# Patient Record
Sex: Male | Born: 1940 | Race: Asian | Hispanic: No | Marital: Married | Smoking: Never smoker
Health system: Southern US, Community
[De-identification: ages and names within clinical notes are randomized; demographics above are authoritative.]

## PROBLEM LIST (undated history)

## (undated) DIAGNOSIS — I2510 Atherosclerotic heart disease of native coronary artery without angina pectoris: Secondary | ICD-10-CM

## (undated) DIAGNOSIS — J449 Chronic obstructive pulmonary disease, unspecified: Secondary | ICD-10-CM

## (undated) DIAGNOSIS — K219 Gastro-esophageal reflux disease without esophagitis: Secondary | ICD-10-CM

## (undated) DIAGNOSIS — M1990 Unspecified osteoarthritis, unspecified site: Secondary | ICD-10-CM

## (undated) DIAGNOSIS — E7800 Pure hypercholesterolemia, unspecified: Secondary | ICD-10-CM

## (undated) DIAGNOSIS — I10 Essential (primary) hypertension: Secondary | ICD-10-CM

## (undated) HISTORY — PX: EYE SURGERY: SHX253

## (undated) HISTORY — PX: CORONARY ANGIOPLASTY WITH STENT PLACEMENT: SHX49

## (undated) HISTORY — PX: HERNIA REPAIR: SHX51

---

## 2014-01-19 ENCOUNTER — Emergency Department (HOSPITAL_COMMUNITY)
Admission: EM | Admit: 2014-01-19 | Discharge: 2014-01-20 | Disposition: A | Discharge status: Home / Self Care | Payer: Medicare Other | Attending: Emergency Medicine | Admitting: Emergency Medicine

## 2014-01-19 ENCOUNTER — Encounter (HOSPITAL_COMMUNITY): Payer: Self-pay | Admitting: Emergency Medicine

## 2014-01-19 DIAGNOSIS — IMO0002 Reserved for concepts with insufficient information to code with codable children: Secondary | ICD-10-CM | POA: Insufficient documentation

## 2014-01-19 DIAGNOSIS — J449 Chronic obstructive pulmonary disease, unspecified: Secondary | ICD-10-CM | POA: Insufficient documentation

## 2014-01-19 DIAGNOSIS — R609 Edema, unspecified: Secondary | ICD-10-CM | POA: Insufficient documentation

## 2014-01-19 DIAGNOSIS — Z9861 Coronary angioplasty status: Secondary | ICD-10-CM | POA: Insufficient documentation

## 2014-01-19 DIAGNOSIS — I10 Essential (primary) hypertension: Secondary | ICD-10-CM | POA: Insufficient documentation

## 2014-01-19 DIAGNOSIS — I2510 Atherosclerotic heart disease of native coronary artery without angina pectoris: Secondary | ICD-10-CM | POA: Insufficient documentation

## 2014-01-19 DIAGNOSIS — J4489 Other specified chronic obstructive pulmonary disease: Secondary | ICD-10-CM | POA: Insufficient documentation

## 2014-01-19 DIAGNOSIS — Z862 Personal history of diseases of the blood and blood-forming organs and certain disorders involving the immune mechanism: Secondary | ICD-10-CM | POA: Insufficient documentation

## 2014-01-19 DIAGNOSIS — I998 Other disorder of circulatory system: Secondary | ICD-10-CM | POA: Insufficient documentation

## 2014-01-19 DIAGNOSIS — Z79899 Other long term (current) drug therapy: Secondary | ICD-10-CM | POA: Insufficient documentation

## 2014-01-19 DIAGNOSIS — Z8639 Personal history of other endocrine, nutritional and metabolic disease: Secondary | ICD-10-CM | POA: Insufficient documentation

## 2014-01-19 DIAGNOSIS — M129 Arthropathy, unspecified: Secondary | ICD-10-CM | POA: Insufficient documentation

## 2014-01-19 DIAGNOSIS — Z791 Long term (current) use of non-steroidal anti-inflammatories (NSAID): Secondary | ICD-10-CM | POA: Insufficient documentation

## 2014-01-19 DIAGNOSIS — Z8719 Personal history of other diseases of the digestive system: Secondary | ICD-10-CM | POA: Insufficient documentation

## 2014-01-19 DIAGNOSIS — Z7982 Long term (current) use of aspirin: Secondary | ICD-10-CM | POA: Insufficient documentation

## 2014-01-19 HISTORY — DX: Chronic obstructive pulmonary disease, unspecified: J44.9

## 2014-01-19 HISTORY — DX: Unspecified osteoarthritis, unspecified site: M19.90

## 2014-01-19 HISTORY — DX: Gastro-esophageal reflux disease without esophagitis: K21.9

## 2014-01-19 HISTORY — DX: Pure hypercholesterolemia, unspecified: E78.00

## 2014-01-19 HISTORY — DX: Essential (primary) hypertension: I10

## 2014-01-19 LAB — COMPREHENSIVE METABOLIC PANEL
ALBUMIN: 3.3 g/dL — AB (ref 3.5–5.2)
ALT: 21 U/L (ref 0–53)
AST: 35 U/L (ref 0–37)
Alkaline Phosphatase: 56 U/L (ref 39–117)
BUN: 15 mg/dL (ref 6–23)
CALCIUM: 8.8 mg/dL (ref 8.4–10.5)
CO2: 25 mEq/L (ref 19–32)
CREATININE: 1.35 mg/dL (ref 0.50–1.35)
Chloride: 99 mEq/L (ref 96–112)
GFR calc Af Amer: 59 mL/min — ABNORMAL LOW (ref >90)
GFR calc non Af Amer: 51 mL/min — ABNORMAL LOW (ref >90)
Glucose, Bld: 202 mg/dL — ABNORMAL HIGH (ref 70–99)
Potassium: 4 mEq/L (ref 3.7–5.3)
Sodium: 135 mEq/L — ABNORMAL LOW (ref 137–147)
TOTAL PROTEIN: 6.7 g/dL (ref 6.0–8.3)
Total Bilirubin: 0.3 mg/dL (ref 0.3–1.2)

## 2014-01-19 LAB — URINALYSIS, ROUTINE W REFLEX MICROSCOPIC
Bilirubin Urine: NEGATIVE
GLUCOSE, UA: NEGATIVE mg/dL
Hgb urine dipstick: NEGATIVE
Ketones, ur: NEGATIVE mg/dL
LEUKOCYTES UA: NEGATIVE
NITRITE: NEGATIVE
PH: 6.5 (ref 5.0–8.0)
Protein, ur: NEGATIVE mg/dL
SPECIFIC GRAVITY, URINE: 1.004 — AB (ref 1.005–1.030)
Urobilinogen, UA: 0.2 mg/dL (ref 0.0–1.0)

## 2014-01-19 LAB — CBC WITH DIFFERENTIAL/PLATELET
BASOS ABS: 0.1 10*3/uL (ref 0.0–0.1)
BASOS PCT: 1 % (ref 0–1)
EOS PCT: 24 % — AB (ref 0–5)
Eosinophils Absolute: 1.3 10*3/uL — ABNORMAL HIGH (ref 0.0–0.7)
HCT: 38.7 % — ABNORMAL LOW (ref 39.0–52.0)
Hemoglobin: 13 g/dL (ref 13.0–17.0)
LYMPHS PCT: 26 % (ref 12–46)
Lymphs Abs: 1.4 10*3/uL (ref 0.7–4.0)
MCH: 26.1 pg (ref 26.0–34.0)
MCHC: 33.6 g/dL (ref 30.0–36.0)
MCV: 77.7 fL — AB (ref 78.0–100.0)
MONO ABS: 0.5 10*3/uL (ref 0.1–1.0)
Monocytes Relative: 9 % (ref 3–12)
Neutro Abs: 2.3 10*3/uL (ref 1.7–7.7)
Neutrophils Relative %: 41 % — ABNORMAL LOW (ref 43–77)
Platelets: 110 10*3/uL — ABNORMAL LOW (ref 150–400)
RBC: 4.98 MIL/uL (ref 4.22–5.81)
RDW: 13.9 % (ref 11.5–15.5)
WBC: 5.6 10*3/uL (ref 4.0–10.5)

## 2014-01-19 LAB — I-STAT TROPONIN, ED: TROPONIN I, POC: 0.01 ng/mL (ref 0.00–0.08)

## 2014-01-19 LAB — PRO B NATRIURETIC PEPTIDE: PRO B NATRI PEPTIDE: 158.1 pg/mL — AB (ref 0–125)

## 2014-01-19 LAB — PROTIME-INR
INR: 0.98 (ref 0.00–1.49)
Prothrombin Time: 12.8 seconds (ref 11.6–15.2)

## 2014-01-19 MED ORDER — FUROSEMIDE 20 MG PO TABS: 20.0000 mg | ORAL_TABLET | Freq: Every day | ORAL | Status: DC

## 2014-01-19 NOTE — Progress Notes (Signed)
   CARE MANAGEMENT ED NOTE 01/19/2014  Patient:  Jesse Scott,Jesse Scott   Account Number:  [id number]  Date Initiated:  01/19/2014  Documentation initiated by:  Radford PaxFERRERO,Yaniv Lage  Subjective/Objective Assessment:   Patient presents to Ed with swelling, burning and ecchymosis to bilateral lower extremities     Subjective/Objective Assessment Detail:     Action/Plan:   Action/Plan Detail:   Anticipated DC Date:       Status Recommendation to Physician:   Result of Recommendation:    Other ED Services  Consult Working Plan    DC Planning Services  Other  PCP issues    Choice offered to / List presented to:            Status of service:  Completed, signed off  ED Comments:   ED Comments Detail:  Patient's family member reports patient's pcp is Dr. Marlou PorchJason [place]anc[place] .  System updated.

## 2014-01-19 NOTE — ED Notes (Signed)
Pt aware of need for urine specimen, attempting to void at this time.

## 2014-01-19 NOTE — Discharge Instructions (Signed)
Edema °Edema is a buildup of fluids. It is most common in the feet, ankles, and legs. This happens more as a person ages. It may affect one or both legs. °HOME CARE  °· Raise (elevate) the legs or ankles above the level of the heart while lying down. °· Avoid sitting or standing still for a long time. °· Exercise the legs to help the puffiness (swelling) go down. °· A low-salt diet may help lessen the puffiness. °· Only take medicine as told by your doctor. °GET HELP RIGHT AWAY IF:  °· You develop shortness of breath or chest pain. °· You cannot breathe when you lie down. °· You have more puffiness that does not go away with treatment. °· You develop pain or redness in the areas that are puffy. °· You have a temperature by mouth above 102° F (38.9° C), not controlled by medicine. °· You gain 03 lb/1.4 kg or more in 1 day or 05 lb/2.3 kg in a week. °MAKE SURE YOU:  °· Understand these instructions. °· Will watch your condition. °· Will get help right away if you are not doing well or get worse. °Document Released: 03/24/2008 Document Revised: 12/29/2011 Document Reviewed: 03/24/2008 °[employer]® Patient Information ©2014 [employer]. ° °

## 2014-01-19 NOTE — ED Provider Notes (Signed)
CSN: [id number]     Arrival date & time 01/19/14  2112 History   First MD Initiated Contact with Patient 01/19/14 2128     Chief Complaint  Patient presents with  . 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 There is no treatment prior to arrival. Past Medical History  Diagnosis Date  . Arthritis   . Hypertension   . High cholesterol   . GERD (gastroesophageal reflux disease)   . COPD (chronic obstructive pulmonary disease)    coronary artery disease Chronic painful peripheral neuropathy edema Past Surgical History  Procedure Laterality Date  . Hernia repair    . Eye surgery    . Coronary angioplasty with stent placement     Family History  Problem Relation Age of Onset  . Family history unknown: Yes   History  Substance Use Topics  . Smoking status: Never Smoker   . Smokeless tobacco: Not on file  . Alcohol Use: No    Review of Systems 10 Systems reviewed and are negative for acute change except as noted in the HPI.   Allergies  Review of patient's allergies indicates no known allergies.  Home Medications   Current Outpatient Rx  Name  Route  Sig  Dispense  Refill  . aspirin EC 81 MG tablet   Oral   Take 81 mg by mouth daily.         . CRESTOR 20 MG tablet   Oral   Take 20 mg by mouth daily.          Elwin Sleight. DULERA 100-5 MCG/ACT  AERO   Inhalation   Inhale 2 puffs into the lungs 2 (two) times daily.          . fluticasone (FLONASE) 50 MCG/ACT nasal spray   Each Nare   Place 1 spray into both nostrils daily.          Marland Kitchen. gabapentin (NEURONTIN) 300 MG capsule   Oral   Take 300-600 mg by mouth 4 (four) times daily. Patient takes 7am 12pm and 7pm and 600mg  before bedtime         . metoprolol succinate (TOPROL-XL) 50 MG 24 hr tablet   Oral   Take 50 mg by mouth daily. Take with or immediately following a meal.         . NEXIUM 40 MG capsule   Oral   Take 40 mg by mouth daily at 12 noon.          . nitroGLYCERIN (NITROSTAT) 0.4 MG SL tablet   Sublingual   Place 0.4 mg under the tongue every 5 (five) minutes as needed for chest pain.         Marland Kitchen. SINGULAIR 10 MG tablet   Oral   Take 10 mg by mouth at bedtime.          . tamsulosin (FLOMAX) 0.4 MG CAPS capsule   Oral   Take 0.4 mg by mouth daily  after breakfast.          . tiZANidine (ZANAFLEX) 4 MG tablet   Oral   Take 4 mg by mouth every evening.          . traMADol (ULTRAM-ER) 200 MG 24 hr tablet   Oral   Take 200 mg by mouth daily.          . VENTOLIN HFA 108 (90 BASE) MCG/ACT inhaler   Inhalation   Inhale 1 puff into the lungs every 4 (four) hours as needed for wheezing.          . furosemide (LASIX) 20 MG tablet   Oral   Take 1 tablet (20 mg total) by mouth daily.   3 tablet   0    BP 133/76  Pulse 76  Temp(Src) 97.8 F (36.6 C) (Oral)  Resp 16  SpO2 100% Physical Exam  Nursing note and vitals reviewed. Constitutional:  Awake, alert, nontoxic appearance.  HENT:  Head: Atraumatic.  Eyes: Right eye exhibits no discharge. Left eye exhibits no discharge.  Neck: Neck supple.  Cardiovascular: Normal rate and regular rhythm.   No murmur heard. Pulmonary/Chest: Effort normal and breath sounds normal. No respiratory distress. He has no wheezes. He has no rales. He exhibits no tenderness.  Abdominal: Soft. Bowel sounds  are normal. He exhibits no distension. There is no tenderness. There is no rebound and no guarding.  Musculoskeletal: He exhibits edema. He exhibits no tenderness.  Baseline ROM, no obvious new focal weakness. Trace edema to lower legs.  Neurological: He is alert.  Mental status and motor strength appears baseline for patient and situation.  Skin: No rash noted.  Few scattered small nontender ecchymoses less than 3 cm each, one on abdomen, one on right thigh, 1 on each forearm  Psychiatric: He has a normal mood and affect.    ED Course  Procedures (including critical care time) Pt stable in ED with no significant deterioration in condition.Patient / Family / Caregiver informed of clinical course, understand medical decision-making process, and agree with plan. Labs Review Labs Reviewed  CBC WITH DIFFERENTIAL - Abnormal; Notable for the following:    HCT 38.7 (*)    MCV 77.7 (*)    Platelets 110 (*)    Neutrophils Relative % 41 (*)    Eosinophils Relative 24 (*)    Eosinophils Absolute 1.3 (*)    All other components within normal limits  COMPREHENSIVE METABOLIC PANEL - Abnormal; Notable for the following:    Sodium 135 (*)    Glucose, Bld 202 (*)    Albumin 3.3 (*)    GFR calc non Af Amer 51 (*)    GFR calc Af Amer 59 (*)    All other components within normal limits  URINALYSIS, ROUTINE W REFLEX MICROSCOPIC - Abnormal; Notable for the following:    Specific Gravity, Urine 1.004 (*)    All other components within normal limits  PRO B NATRIURETIC PEPTIDE - Abnormal; Notable for the following:    Pro B Natriuretic peptide (BNP) 158.1 (*)    All other components within normal limits  PROTIME-INR  I-STAT TROPOININ, ED   Imaging Review No results found.   EKG Interpretation   Date/Time:  Thursday January 19 2014 21:46:32 EDT Ventricular Rate:  87 PR Interval:  180 QRS Duration: 99 QT Interval:  379 QTC Calculation: 456 R Axis:   49 Text Interpretation:  Sinus rhythm  Ventricular bigeminy Biatrial  enlargement Abnormal R-wave progression, early transition Left  ventricular  hypertrophy Baseline wander in lead(s) V6 No previous ECGs available  Confirmed by [facility]  MD, Jonny Ruiz ([id number]) on 01/19/2014 11:55:55 PM      MDM   Final diagnoses:  Edema    I doubt any other EMC precluding discharge at this time including, but not necessarily limited to the following:renal failure, ACS.    Hurman Horn, MD 01/20/14 [phone]

## 2014-01-19 NOTE — ED Notes (Signed)
Family states a couple days ago his feet were swollen and has been developing random bruises on his body he states are painful  Pt has also been c/o burning sensation in his lower extremities

## 2014-01-19 NOTE — ED Notes (Signed)
Initial Contact - pt to RM3 with family, changed to hospital gown, placed to cardiac/02 monitor.  Per family, pt with inc swelling to BLE, 1+ edema noted.  Pt also with c/o "burning" in bilat legs.  Both of which are not new per family.  Per family, pt also with random bruising x3-4 days.  Pt noted to have bruises to arms, legs and abd.  Pt denies injury or bleeding elsewhere.  Skin otherwise PWD.  Speaking full/clear sentences.  A+Ox4.  NAD.

## 2014-08-05 ENCOUNTER — Emergency Department (HOSPITAL_COMMUNITY): Payer: Medicare Other

## 2014-08-05 ENCOUNTER — Encounter (HOSPITAL_COMMUNITY): Payer: Self-pay | Admitting: Emergency Medicine

## 2014-08-05 ENCOUNTER — Emergency Department (HOSPITAL_COMMUNITY)
Admission: EM | Admit: 2014-08-05 | Discharge: 2014-08-05 | Disposition: A | Discharge status: Home / Self Care | Payer: Medicare Other | Attending: Emergency Medicine | Admitting: Emergency Medicine

## 2014-08-05 DIAGNOSIS — S8992XA Unspecified injury of left lower leg, initial encounter: Secondary | ICD-10-CM | POA: Diagnosis present

## 2014-08-05 DIAGNOSIS — I10 Essential (primary) hypertension: Secondary | ICD-10-CM | POA: Diagnosis not present

## 2014-08-05 DIAGNOSIS — E780 Pure hypercholesterolemia: Secondary | ICD-10-CM | POA: Diagnosis not present

## 2014-08-05 DIAGNOSIS — Z9861 Coronary angioplasty status: Secondary | ICD-10-CM | POA: Diagnosis not present

## 2014-08-05 DIAGNOSIS — J449 Chronic obstructive pulmonary disease, unspecified: Secondary | ICD-10-CM | POA: Diagnosis not present

## 2014-08-05 DIAGNOSIS — Z7951 Long term (current) use of inhaled steroids: Secondary | ICD-10-CM | POA: Diagnosis not present

## 2014-08-05 DIAGNOSIS — M1990 Unspecified osteoarthritis, unspecified site: Secondary | ICD-10-CM | POA: Diagnosis not present

## 2014-08-05 DIAGNOSIS — Z7952 Long term (current) use of systemic steroids: Secondary | ICD-10-CM | POA: Diagnosis not present

## 2014-08-05 DIAGNOSIS — S9030XA Contusion of unspecified foot, initial encounter: Secondary | ICD-10-CM | POA: Insufficient documentation

## 2014-08-05 DIAGNOSIS — Z79899 Other long term (current) drug therapy: Secondary | ICD-10-CM | POA: Diagnosis not present

## 2014-08-05 DIAGNOSIS — K219 Gastro-esophageal reflux disease without esophagitis: Secondary | ICD-10-CM | POA: Insufficient documentation

## 2014-08-05 DIAGNOSIS — S8012XA Contusion of left lower leg, initial encounter: Secondary | ICD-10-CM | POA: Insufficient documentation

## 2014-08-05 DIAGNOSIS — T148XXA Other injury of unspecified body region, initial encounter: Secondary | ICD-10-CM

## 2014-08-05 DIAGNOSIS — Z7982 Long term (current) use of aspirin: Secondary | ICD-10-CM | POA: Diagnosis not present

## 2014-08-05 MED ORDER — NAPROXEN 500 MG PO TABS: 500.0000 mg | ORAL_TABLET | Freq: Two times a day (BID) | ORAL | Status: DC

## 2014-08-05 NOTE — ED Notes (Addendum)
Pt family states pt dropped drill on left leg today, bruising and redness noted to left anterior aspect of foot and also left leg. Pulses present, full ROM noted.

## 2014-08-05 NOTE — Discharge Instructions (Signed)
Contusion A contusion is a deep bruise. Contusions are the result of an injury that caused bleeding under the skin. The contusion may turn blue, purple, or yellow. Minor injuries will give you a painless contusion, but more severe contusions may stay painful and swollen for a few weeks.  CAUSES  A contusion is usually caused by a blow, trauma, or direct force to an area of the body. SYMPTOMS   Swelling and redness of the injured area.  Bruising of the injured area.  Tenderness and soreness of the injured area.  Pain. DIAGNOSIS  The diagnosis can be made by taking a history and physical exam. An X-ray, CT scan, or MRI may be needed to determine if there were any associated injuries, such as fractures. TREATMENT  Specific treatment will depend on what area of the body was injured. In general, the best treatment for a contusion is resting, icing, elevating, and applying cold compresses to the injured area. Over-the-counter medicines may also be recommended for pain control. Ask your caregiver what the best treatment is for your contusion. HOME CARE INSTRUCTIONS   Put ice on the injured area.  Put ice in a plastic bag.  Place a towel between your skin and the bag.  Leave the ice on for 15-20 minutes, 3-4 times a day, or as directed by your health care provider.  Only take over-the-counter or prescription medicines for pain, discomfort, or fever as directed by your caregiver. Your caregiver may recommend avoiding anti-inflammatory medicines (aspirin, ibuprofen, and naproxen) for 48 hours because these medicines may increase bruising.  Rest the injured area.  If possible, elevate the injured area to reduce swelling. SEEK IMMEDIATE MEDICAL CARE IF:   You have increased bruising or swelling.  You have pain that is getting worse.  Your swelling or pain is not relieved with medicines. MAKE SURE YOU:   Understand these instructions.  Will watch your condition.  Will get help right  away if you are not doing well or get worse. Document Released: 07/16/2005 Document Revised: 10/11/2013 Document Reviewed: 08/11/2011 [facility] Patient Information 2015 [place], [place]. This information is not intended to replace advice given to you by your health care provider. Make sure you discuss any questions you have with your health care provider. Compartment Syndrome of the Foot Compartment syndrome of the foot is a condition in which increased tissue pressure in a confined space in your foot causes decreased blood flow. Decreased blood flow can lead to muscle weakness and loss of feeling in your foot. Compartments of your foot contain bones, muscles, blood vessels, and nerves that are wrapped tightly together by tough fibrous tissues (fascia). When an injury occurs to these compartments, there is no room for the tissue to swell. The dangerously high pressure in compartment syndrome restricts the flow of blood to and from the injured areas. Having the syndrome can be an emergency requiring immediate surgery to prevent permanent injury. CAUSES A crushing type of injury to your foot. RISK FACTORS  Blood clots.  Surgery to blood vessels of your leg or foot.  Prolonged compression during a period of consciousness.  Extremely vigorous exercise.  Anabolic steroid use.  Trauma.  An infection throughout the body (sepsis).  Overly tight bandages or casts.  Bites from venomous animals like snakes.  Anticoagulant medicine use that leads to excessive bleeding into the compartments after an injury.  Burns. SIGNS AND SYMPTOMS  New and persistent deep ache.  Pain that is greater than expected for the severity of  an injury.  Numbness, a "pins-and-needles," or electricity-like pain in the foot.  Swelling and tightness.  Bruising. DIAGNOSIS  Your health care provider will perform a physical exam. Sometimes pressures are taken within the compartment. TREATMENT  Treatment involves  relief of pressure in the compartment by cutting the fascia that surrounds it (fasciotomy). You will remain in the hospital while the cuts (incisions) are usually left open for several days. When the swelling has gone away, your incision will be closed.  Document Released: 01/12/2001 Document Revised: 10/11/2013 Document Reviewed: 05/25/2013 [facility] Patient Information 2015 [place]. This information is not intended to replace advice given to you by your health care provider. Make sure you discuss any questions you have with your health care provider.  Deep Vein Thrombosis A deep vein thrombosis (DVT) is a blood clot that develops in the deep, larger veins of the leg, arm, or pelvis. These are more dangerous than clots that might form in veins near the surface of the body. A DVT can lead to serious and even life-threatening complications if the clot breaks off and travels in the bloodstream to the lungs.  A DVT can damage the valves in your leg veins so that instead of flowing upward, the blood pools in the lower leg. This is called post-thrombotic syndrome, and it can result in pain, swelling, discoloration, and sores on the leg. CAUSES Usually, several things contribute to the formation of blood clots. Contributing factors include:  The flow of blood slows down.  The inside of the vein is damaged in some way.  You have a condition that makes blood clot more easily. RISK FACTORS Some people are more likely than others to develop blood clots. Risk factors include:   Smoking.  Being overweight (obese).  Sitting or lying still for a long time. This includes long-distance travel, paralysis, or recovery from an illness or surgery. Other factors that increase risk are:   Older age, especially over 73 years of age.  Having a family history of blood clots or if you have already had a blot clot.  Having major or lengthy surgery. This is especially true for surgery on the hip, knee, or  belly (abdomen). Hip surgery is particularly high risk.  Having a long, thin tube (catheter) placed inside a vein during a medical procedure.  Breaking a hip or leg.  Having cancer or cancer treatment.  Pregnancy and childbirth.  Hormone changes make the blood clot more easily during pregnancy.  The fetus puts pressure on the veins of the pelvis.  There is a risk of injury to veins during delivery or a caesarean delivery. The risk is highest just after childbirth.  Medicines containing the male hormone estrogen. This includes birth control pills and hormone replacement therapy.  Other circulation or heart problems.  SIGNS AND SYMPTOMS When a clot forms, it can either partially or totally block the blood flow in that vein. Symptoms of a DVT can include:  Swelling of the leg or arm, especially if one side is much worse.  Warmth and redness of the leg or arm, especially if one side is much worse.  Pain in an arm or leg. If the clot is in the leg, symptoms may be more noticeable or worse when standing or walking. The symptoms of a DVT that has traveled to the lungs (pulmonary embolism, PE) usually start suddenly and include:  Shortness of breath.  Coughing.  Coughing up blood or blood-tinged mucus.  Chest pain. The chest pain is  often worse with deep breaths.  Rapid heartbeat. Anyone with these symptoms should get emergency medical treatment right away. Do not wait to see if the symptoms will go away. Call your local emergency services (911 in the [place] if you have these symptoms. Do not drive yourself to the hospital. DIAGNOSIS If a DVT is suspected, your health care provider will take a full medical history and perform a physical exam. Tests that also may be required include:  Blood tests, including studies of the clotting properties of the blood.  Ultrasound to see if you have clots in your legs or lungs.  X-rays to show the flow of blood when dye is injected into the  veins (venogram).  Studies of your lungs if you have any chest symptoms. PREVENTION  Exercise the legs regularly. Take a brisk 30-minute walk every day.  Maintain a weight that is appropriate for your height.  Avoid sitting or lying in bed for long periods of time without moving your legs.  Women, particularly those over the age of 35 years, should consider the risks and benefits of taking estrogen medicines, including birth control pills.  Do not smoke, especially if you take estrogen medicines.  Long-distance travel can increase your risk of DVT. You should exercise your legs by walking or pumping the muscles every hour.  Many of the risk factors above relate to situations that exist with hospitalization, either for illness, injury, or elective surgery. Prevention may include medical and nonmedical measures.  Your health care provider will assess you for the need for venous thromboembolism prevention when you are admitted to the hospital. If you are having surgery, your surgeon will assess you the day of or day after surgery. TREATMENT Once identified, a DVT can be treated. It can also be prevented in some circumstances. Once you have had a DVT, you may be at increased risk for a DVT in the future. The most common treatment for DVT is blood-thinning (anticoagulant) medicine, which reduces the blood's tendency to clot. Anticoagulants can stop new blood clots from forming and stop old clots from growing. They cannot dissolve existing clots. Your body does this by itself over time. Anticoagulants can be given by mouth, through an IV tube, or by injection. Your health care provider will determine the best program for you. Other medicines or treatments that may be used are:  Heparin or related medicines (low molecular weight heparin) are often the first treatment for a blood clot. They act quickly. However, they cannot be taken orally and must be given either in shot form or by IV  tube.  Heparin can cause a fall in a component of blood that stops bleeding and forms blood clots (platelets). You will be monitored with blood tests to be sure this does not occur.  Warfarin is an anticoagulant that can be swallowed. It takes a few days to start working, so usually heparin or related medicines are used in combination. Once warfarin is working, heparin is usually stopped.  Factor Xa inhibitor medicines, such as rivaroxaban and apixaban, also reduce blood clotting. These medicines are taken orally and can often be used without heparin or related medicines.  Less commonly, clot dissolving drugs (thrombolytics) are used to dissolve a DVT. They carry a high risk of bleeding, so they are used mainly in severe cases where your life or a part of your body is threatened.  Very rarely, a blood clot in the leg needs to be removed surgically.  If you are unable  to take anticoagulants, your health care provider may arrange for you to have a filter placed in a main vein in your abdomen. This filter prevents clots from traveling to your lungs. HOME CARE INSTRUCTIONS  Take all medicines as directed by your health care provider.  Learn as much as you can about DVT.  Wear a medical alert bracelet or carry a medical alert card.  Ask your health care provider how soon you can go back to normal activities. It is important to stay active to prevent blood clots. If you are on anticoagulant medicine, avoid contact sports.  It is very important to exercise. This is especially important while traveling, sitting, or standing for long periods of time. Exercise your legs by walking or by tightening and relaxing your leg muscles regularly. Take frequent walks.  You may need to wear compression stockings. These are tight elastic stockings that apply pressure to the lower legs. This pressure can help keep the blood in the legs from clotting. Taking Warfarin Warfarin is a daily medicine that is taken by  mouth. Your health care provider will advise you on the length of treatment (usually 3-6 months, sometimes lifelong). If you take warfarin:  Understand how to take warfarin and foods that can affect how warfarin works in Public relations account executive.  Too much and too little warfarin are both dangerous. Too much warfarin increases the risk of bleeding. Too little warfarin continues to allow the risk for blood clots. Warfarin and Regular Blood Testing While taking warfarin, you will need to have regular blood tests to measure your blood clotting time. These blood tests usually include both the prothrombin time (PT) and international normalized ratio (INR) tests. The PT and INR results allow your health care provider to adjust your dose of warfarin. It is very important that you have your PT and INR tested as often as directed by your health care provider.  Warfarin and Your Diet Avoid major changes in your diet, or notify your health care provider before changing your diet. Arrange a visit with a registered dietitian to answer your questions. Many foods, especially foods high in vitamin K, can interfere with warfarin and affect the PT and INR results. You should eat a consistent amount of foods high in vitamin K. Foods high in vitamin K include:   Spinach, kale, broccoli, cabbage, collard and turnip greens, Brussels sprouts, peas, cauliflower, seaweed, and parsley.  Beef and pork liver.  Green tea.  Soybean oil. Warfarin with Other Medicines Many medicines can interfere with warfarin and affect the PT and INR results. You must:  Tell your health care provider about any and all medicines, vitamins, and supplements you take, including aspirin and other over-the-counter anti-inflammatory medicines. Be especially cautious with aspirin and anti-inflammatory medicines. Ask your health care provider before taking these.  Do not take or discontinue any prescribed or over-the-counter medicine except on the advice of your  health care provider or pharmacist. Warfarin Side Effects Warfarin can have side effects, such as easy bruising and difficulty stopping bleeding. Ask your health care provider or pharmacist about other side effects of warfarin. You will need to:  Hold pressure over cuts for longer than usual.  Notify your dentist and other health care providers that you are taking warfarin before you undergo any procedures where bleeding may occur. Warfarin with Alcohol and Tobacco   Drinking alcohol frequently can increase the effect of warfarin, leading to excess bleeding. It is best to avoid alcoholic drinks or to consume only  very small amounts while taking warfarin. Notify your health care provider if you change your alcohol intake.   Do not use any tobacco products including cigarettes, chewing tobacco, or electronic cigarettes. If you smoke, quit. Ask your health care provider for help with quitting smoking. Alternative Medicines to Warfarin: Factor Xa Inhibitor Medicines  These blood-thinning medicines are taken by mouth, usually for several weeks or longer. It is important to take the medicine every single day at the same time each day.  There are no regular blood tests required when using these medicines.  There are fewer food and drug interactions than with warfarin.  The side effects of this class of medicine are similar to those of warfarin, including excessive bruising or bleeding. Ask your health care provider or pharmacist about other potential side effects. SEEK MEDICAL CARE IF:  You notice a rapid heartbeat.  You feel weaker or more tired than usual.  You feel faint.  You notice increased bruising.  You feel your symptoms are not getting better in the time expected.  You believe you are having side effects of medicine. SEEK IMMEDIATE MEDICAL CARE IF:  You have chest pain.  You have trouble breathing.  You have new or increased swelling or pain in one leg.  You cough up  blood.  You notice blood in vomit, in a bowel movement, or in urine. MAKE SURE YOU:  Understand these instructions.  Will watch your condition.  Will get help right away if you are not doing well or get worse. Document Released: 10/06/2005 Document Revised: 02/20/2014 Document Reviewed: 06/13/2013 [facility] Patient Information 2015 [place], [place]. This information is not intended to replace advice given to you by your health care provider. Make sure you discuss any questions you have with your health care provider.

## 2014-08-05 NOTE — ED Provider Notes (Signed)
CSN: [id number]     Arrival date & time 08/05/14  1839 History  This chart was scribed for non-physician practitioner, Arthor CaptainAbigail Raymund Manrique, PA-C,working with Flint MelterElliott L Wentz, MD, by Karle PlumberJennifer Tensley, ED Scribe. This patient was seen in room TR11C/TR11C and the patient's care was started at 9:16 PM.  Chief Complaint  Patient presents with  . Leg Pain   Patient is a 73 y.o. male presenting with leg pain. The history is provided by the patient. No language interpreter was used.  Leg Pain Associated symptoms: no fever    HPI Comments:  Jesse Scott is a 73 y.o. male with PMHx of HTN and hyperlipidemia who presents to the Emergency Department complaining of a left foot and leg injury secondary to dropping a drill on the foot approximately three hours ago. He reports moderate pain and bruising of the left dorsal foot and anterior lower shin. He has not taken anything for the pain. He denies numbness, tingling, or weakness of the lower extremities, nausea, vomiting or calf swelling. Pt is not a smoker. Pt is ambulatory without issue.  Past Medical History  Diagnosis Date  . Arthritis   . Hypertension   . High cholesterol   . GERD (gastroesophageal reflux disease)   . COPD (chronic obstructive pulmonary disease)    Past Surgical History  Procedure Laterality Date  . Hernia repair    . Eye surgery    . Coronary angioplasty with stent placement     No family history on file. History  Substance Use Topics  . Smoking status: Never Smoker   . Smokeless tobacco: Not on file  . Alcohol Use: No    Review of Systems  Constitutional: Negative for fever and chills.  Gastrointestinal: Negative for nausea and vomiting.  Musculoskeletal: Positive for arthralgias.  Skin: Positive for color change.  Neurological: Negative for weakness and numbness.    Allergies  Review of patient's allergies indicates no known allergies.  Home Medications   Prior to Admission medications   Medication Sig Start Date  End Date Taking? Authorizing Provider  alendronate (FOSAMAX) 70 MG tablet Take 70 mg by mouth once a week. Take with a full glass of water on an empty stomach. Take on Mondays   Yes Historical Provider, MD  aspirin EC 81 MG tablet Take 81 mg by mouth daily.   Yes Historical Provider, MD  CRESTOR 20 MG tablet Take 20 mg by mouth daily.  12/26/13  Yes Historical Provider, MD  DULERA 100-5 MCG/ACT AERO Inhale 2 puffs into the lungs 2 (two) times daily.  01/09/14  Yes Historical Provider, MD  fluticasone (FLONASE) 50 MCG/ACT nasal spray Place 1 spray into both nostrils daily.  11/01/13  Yes Historical Provider, MD  gabapentin (NEURONTIN) 300 MG capsule Take 300-600 mg by mouth 4 (four) times daily. Patient takes 7am 12pm and 7pm and 600mg  before bedtime 12/26/13  Yes Historical Provider, MD  NEXIUM 40 MG capsule Take 40 mg by mouth daily at 12 noon.  11/17/13  Yes Historical Provider, MD  nitroGLYCERIN (NITROSTAT) 0.4 MG SL tablet Place 0.4 mg under the tongue every 5 (five) minutes as needed for chest pain.   Yes Historical Provider, MD  prednisoLONE 5 MG TABS tablet Take 5 mg by mouth daily.   Yes Historical Provider, MD  SINGULAIR 10 MG tablet Take 10 mg by mouth at bedtime.  11/17/13  Yes Historical Provider, MD  tamsulosin (FLOMAX) 0.4 MG CAPS capsule Take 0.4 mg by mouth daily after breakfast.  01/17/14  Yes Historical Provider, MD  tiZANidine (ZANAFLEX) 4 MG tablet Take 4 mg by mouth every evening.  11/04/13  Yes Historical Provider, MD  traMADol (ULTRAM-ER) 200 MG 24 hr tablet Take 200 mg by mouth daily.  10/28/13  Yes Historical Provider, MD  VENTOLIN HFA 108 (90 BASE) MCG/ACT inhaler Inhale 1 puff into the lungs every 4 (four) hours as needed for wheezing.  11/28/13  Yes Historical Provider, MD   Triage Vitals: BP 164/81  Pulse 76  Temp(Src) 97.8 F (36.6 C) (Oral)  Resp 15  SpO2 93% Physical Exam  Nursing note and vitals reviewed. Constitutional: He is oriented to person, place, and time. He appears  well-developed and well-nourished.  HENT:  Head: Normocephalic and atraumatic.  Eyes: EOM are normal.  Neck: Normal range of motion.  Cardiovascular: Normal rate.   Good pedal pulses bilaterally.  Pulmonary/Chest: Effort normal.  Musculoskeletal: Normal range of motion.  Contusion over distal tibia and fibula and over mid foot. Full ROM of left ankle and toes.  Neurological: He is alert and oriented to person, place, and time.  Sensations intact bilaterally.  Skin: Skin is warm and dry.  Psychiatric: He has a normal mood and affect. His behavior is normal.    ED Course  Procedures (including critical care time) DIAGNOSTIC STUDIES: Oxygen Saturation is 93% on RA, low by my interpretation.   COORDINATION OF CARE: 9:22 PM- Return precautions discussed. Advised pt to apply ice compact and will prescribe NSAID. Pt verbalizes understanding and agrees to plan.  Medications - No data to display  Labs Review Labs Reviewed - No data to display  Imaging Review Dg Tibia/fibula Left  08/05/2014   CLINICAL DATA:  Mid left lower leg bruising, pain and bright redness after dropping a metal drill on his left foot today.  EXAM: LEFT TIBIA AND FIBULA - 2 VIEW  COMPARISON:  None.  FINDINGS: Minimal left knee degenerative spur formation. Minimal calcaneal spur formation. Small amount of vascular calcification. No fracture or dislocation seen.  IMPRESSION: No fracture.   Electronically Signed   By: Gordan PaymentSteve  Reid M.D.   On: 08/05/2014 20:39   Dg Foot Complete Left  08/05/2014   CLINICAL DATA:  Bright redness and pain with bruising on the anterior mid left foot after dropping a metal drill on his foot today.  EXAM: LEFT FOOT - COMPLETE 3+ VIEW  COMPARISON:  None.  FINDINGS: Minimal calcaneal spur formation. No fracture or dislocation. Diffuse osteopenia.  IMPRESSION: No fracture or dislocation.   Electronically Signed   By: Gordan PaymentSteve  Reid M.D.   On: 08/05/2014 20:38     EKG Interpretation None       MDM   Final diagnoses:  Contusion     PLAN: apply ice packs, elevate the injured limb, see primary care physician in follow up, prescription for NSAID given. No signs of DVT. Discussed and dvt sxs and compartment syndrome sxs with patient's family. Written information given. Discussed need for movement and walking to prevent dvt. See orders for this visit as documented in the electronic medical record. I personally reviewed the imaging tests through PACS system. I have reviewed and interpreted Lab values. I reviewed available ER/hospitalization records through the EMR    I personally performed the services described in this documentation, which was scribed in my presence. The recorded information has been reviewed and is accurate.    Arthor CaptainAbigail Breda Bond, PA-C 08/09/14 1148

## 2014-08-05 NOTE — ED Notes (Signed)
Pt reports dropping heavy drill on left foot appx 1 hour ago. No puncture wound. Pt has bruising now to lower left anteiror leg and top of foot. Pt has full ROM. Sensation/pulses intact. Pt in NAD.

## 2014-08-10 NOTE — ED Provider Notes (Signed)
Medical screening examination/treatment/procedure(s) were performed by non-physician practitioner and as supervising physician I was immediately available for consultation/collaboration.   EKG Interpretation None       Doha Boling L Bethsaida Siegenthaler, MD 08/10/14 2054 

## 2015-02-11 ENCOUNTER — Inpatient Hospital Stay (HOSPITAL_COMMUNITY)
Admission: EM | Admit: 2015-02-11 | Discharge: 2015-02-14 | DRG: 193 | Disposition: A | Discharge status: Home / Self Care | Payer: Medicare Other | Attending: Internal Medicine | Admitting: Internal Medicine

## 2015-02-11 ENCOUNTER — Emergency Department (HOSPITAL_COMMUNITY): Payer: Medicare Other

## 2015-02-11 ENCOUNTER — Encounter (HOSPITAL_COMMUNITY): Payer: Self-pay

## 2015-02-11 DIAGNOSIS — R739 Hyperglycemia, unspecified: Secondary | ICD-10-CM | POA: Diagnosis present

## 2015-02-11 DIAGNOSIS — T380X5A Adverse effect of glucocorticoids and synthetic analogues, initial encounter: Secondary | ICD-10-CM | POA: Diagnosis present

## 2015-02-11 DIAGNOSIS — Z79899 Other long term (current) drug therapy: Secondary | ICD-10-CM

## 2015-02-11 DIAGNOSIS — Z955 Presence of coronary angioplasty implant and graft: Secondary | ICD-10-CM | POA: Diagnosis not present

## 2015-02-11 DIAGNOSIS — D696 Thrombocytopenia, unspecified: Secondary | ICD-10-CM | POA: Diagnosis present

## 2015-02-11 DIAGNOSIS — J189 Pneumonia, unspecified organism: Secondary | ICD-10-CM | POA: Diagnosis present

## 2015-02-11 DIAGNOSIS — E860 Dehydration: Secondary | ICD-10-CM | POA: Diagnosis present

## 2015-02-11 DIAGNOSIS — R0902 Hypoxemia: Secondary | ICD-10-CM

## 2015-02-11 DIAGNOSIS — E785 Hyperlipidemia, unspecified: Secondary | ICD-10-CM | POA: Diagnosis present

## 2015-02-11 DIAGNOSIS — J441 Chronic obstructive pulmonary disease with (acute) exacerbation: Secondary | ICD-10-CM | POA: Diagnosis present

## 2015-02-11 DIAGNOSIS — Z7982 Long term (current) use of aspirin: Secondary | ICD-10-CM

## 2015-02-11 DIAGNOSIS — I10 Essential (primary) hypertension: Secondary | ICD-10-CM | POA: Diagnosis present

## 2015-02-11 DIAGNOSIS — E7800 Pure hypercholesterolemia, unspecified: Secondary | ICD-10-CM | POA: Diagnosis present

## 2015-02-11 DIAGNOSIS — N401 Enlarged prostate with lower urinary tract symptoms: Secondary | ICD-10-CM | POA: Diagnosis present

## 2015-02-11 DIAGNOSIS — Z7952 Long term (current) use of systemic steroids: Secondary | ICD-10-CM | POA: Diagnosis not present

## 2015-02-11 DIAGNOSIS — I2510 Atherosclerotic heart disease of native coronary artery without angina pectoris: Secondary | ICD-10-CM | POA: Diagnosis present

## 2015-02-11 DIAGNOSIS — J449 Chronic obstructive pulmonary disease, unspecified: Secondary | ICD-10-CM | POA: Insufficient documentation

## 2015-02-11 DIAGNOSIS — J209 Acute bronchitis, unspecified: Secondary | ICD-10-CM | POA: Diagnosis present

## 2015-02-11 DIAGNOSIS — K219 Gastro-esophageal reflux disease without esophagitis: Secondary | ICD-10-CM | POA: Diagnosis not present

## 2015-02-11 DIAGNOSIS — J9601 Acute respiratory failure with hypoxia: Secondary | ICD-10-CM | POA: Diagnosis present

## 2015-02-11 DIAGNOSIS — E162 Hypoglycemia, unspecified: Secondary | ICD-10-CM | POA: Diagnosis present

## 2015-02-11 DIAGNOSIS — R05 Cough: Secondary | ICD-10-CM | POA: Diagnosis not present

## 2015-02-11 HISTORY — DX: Atherosclerotic heart disease of native coronary artery without angina pectoris: I25.10

## 2015-02-11 LAB — EXPECTORATED SPUTUM ASSESSMENT W GRAM STAIN, RFLX TO RESP C: Special Requests: NORMAL

## 2015-02-11 LAB — URINE MICROSCOPIC-ADD ON

## 2015-02-11 LAB — CBC
HEMATOCRIT: 40.2 % (ref 39.0–52.0)
Hemoglobin: 13.2 g/dL (ref 13.0–17.0)
MCH: 26.3 pg (ref 26.0–34.0)
MCHC: 32.8 g/dL (ref 30.0–36.0)
MCV: 80.2 fL (ref 78.0–100.0)
Platelets: 137 10*3/uL — ABNORMAL LOW (ref 150–400)
RBC: 5.01 MIL/uL (ref 4.22–5.81)
RDW: 15.6 % — ABNORMAL HIGH (ref 11.5–15.5)
WBC: 6.5 10*3/uL (ref 4.0–10.5)

## 2015-02-11 LAB — URINALYSIS, ROUTINE W REFLEX MICROSCOPIC
BILIRUBIN URINE: NEGATIVE
Glucose, UA: NEGATIVE mg/dL
KETONES UR: NEGATIVE mg/dL
LEUKOCYTES UA: NEGATIVE
NITRITE: NEGATIVE
PH: 7.5 (ref 5.0–8.0)
PROTEIN: NEGATIVE mg/dL
SPECIFIC GRAVITY, URINE: 1.008 (ref 1.005–1.030)
Urobilinogen, UA: 0.2 mg/dL (ref 0.0–1.0)

## 2015-02-11 LAB — STREP PNEUMONIAE URINARY ANTIGEN: Strep Pneumo Urinary Antigen: NEGATIVE

## 2015-02-11 LAB — BASIC METABOLIC PANEL
ANION GAP: 3 — AB (ref 5–15)
BUN: 12 mg/dL (ref 6–23)
CO2: 27 mmol/L (ref 19–32)
CREATININE: 1.21 mg/dL (ref 0.50–1.35)
Calcium: 7.9 mg/dL — ABNORMAL LOW (ref 8.4–10.5)
Chloride: 103 mmol/L (ref 96–112)
GFR calc Af Amer: 67 mL/min — ABNORMAL LOW (ref >90)
GFR calc non Af Amer: 58 mL/min — ABNORMAL LOW (ref >90)
GLUCOSE: 65 mg/dL — AB (ref 70–99)
POTASSIUM: 3.5 mmol/L (ref 3.5–5.1)
Sodium: 133 mmol/L — ABNORMAL LOW (ref 135–145)

## 2015-02-11 LAB — EXPECTORATED SPUTUM ASSESSMENT W REFEX TO RESP CULTURE

## 2015-02-11 LAB — I-STAT TROPONIN, ED: TROPONIN I, POC: 0.01 ng/mL (ref 0.00–0.08)

## 2015-02-11 LAB — I-STAT CG4 LACTIC ACID, ED: LACTIC ACID, VENOUS: 0.7 mmol/L (ref 0.5–2.0)

## 2015-02-11 LAB — GLUCOSE, CAPILLARY
GLUCOSE-CAPILLARY: 241 mg/dL — AB (ref 70–99)
GLUCOSE-CAPILLARY: 188 mg/dL — AB (ref 70–99)
GLUCOSE-CAPILLARY: 189 mg/dL — AB (ref 70–99)

## 2015-02-11 LAB — TROPONIN I: Troponin I: <0.03 ng/mL (ref <0.031)

## 2015-02-11 MED ORDER — SODIUM CHLORIDE 0.9 % IJ SOLN
3.0000 mL | Freq: Two times a day (BID) | INTRAMUSCULAR | Status: DC
  Administered 2015-02-12 – 2015-02-13 (×3): 3 mL via INTRAVENOUS

## 2015-02-11 MED ORDER — ONDANSETRON HCL 4 MG PO TABS
4.0000 mg | ORAL_TABLET | Freq: Four times a day (QID) | ORAL | Status: DC | PRN
  Administered 2015-02-12: 4 mg via ORAL
  Filled 2015-02-11: qty 1

## 2015-02-11 MED ORDER — GUAIFENESIN ER 600 MG PO TB12
1200.0000 mg | ORAL_TABLET | Freq: Two times a day (BID) | ORAL | Status: DC
  Administered 2015-02-11 – 2015-02-14 (×7): 1200 mg via ORAL
  Filled 2015-02-11 (×7): qty 2

## 2015-02-11 MED ORDER — ACETAMINOPHEN 325 MG PO TABS
650.0000 mg | ORAL_TABLET | Freq: Once | ORAL | Status: AC
  Administered 2015-02-11: 650 mg via ORAL
  Filled 2015-02-11: qty 2

## 2015-02-11 MED ORDER — ONDANSETRON HCL 4 MG/2ML IJ SOLN: 4.0000 mg | Freq: Four times a day (QID) | INTRAMUSCULAR | Status: DC | PRN

## 2015-02-11 MED ORDER — ROSUVASTATIN CALCIUM 20 MG PO TABS
20.0000 mg | ORAL_TABLET | Freq: Every day | ORAL | Status: DC
  Administered 2015-02-11 – 2015-02-14 (×4): 20 mg via ORAL
  Filled 2015-02-11 (×4): qty 1

## 2015-02-11 MED ORDER — ACETAMINOPHEN 650 MG RE SUPP: 650.0000 mg | Freq: Four times a day (QID) | RECTAL | Status: DC | PRN

## 2015-02-11 MED ORDER — IPRATROPIUM-ALBUTEROL 0.5-2.5 (3) MG/3ML IN SOLN
3.0000 mL | Freq: Once | RESPIRATORY_TRACT | Status: AC
  Administered 2015-02-11: 3 mL via RESPIRATORY_TRACT
  Filled 2015-02-11: qty 3

## 2015-02-11 MED ORDER — METHYLPREDNISOLONE SODIUM SUCC 125 MG IJ SOLR
80.0000 mg | Freq: Once | INTRAMUSCULAR | Status: AC
  Administered 2015-02-11: 80 mg via INTRAVENOUS
  Filled 2015-02-11: qty 2

## 2015-02-11 MED ORDER — CETYLPYRIDINIUM CHLORIDE 0.05 % MT LIQD
7.0000 mL | Freq: Two times a day (BID) | OROMUCOSAL | Status: DC
  Administered 2015-02-12 – 2015-02-14 (×5): 7 mL via OROMUCOSAL

## 2015-02-11 MED ORDER — DEXTROSE 5 % IV SOLN
500.0000 mg | INTRAVENOUS | Status: DC
  Administered 2015-02-12: 500 mg via INTRAVENOUS
  Filled 2015-02-11: qty 500

## 2015-02-11 MED ORDER — MOMETASONE FURO-FORMOTEROL FUM 100-5 MCG/ACT IN AERO
2.0000 | INHALATION_SPRAY | Freq: Two times a day (BID) | RESPIRATORY_TRACT | Status: DC
  Administered 2015-02-11 – 2015-02-14 (×6): 2 via RESPIRATORY_TRACT
  Filled 2015-02-11: qty 8.8

## 2015-02-11 MED ORDER — PANTOPRAZOLE SODIUM 40 MG PO TBEC
40.0000 mg | DELAYED_RELEASE_TABLET | Freq: Every day | ORAL | Status: DC
  Administered 2015-02-11 – 2015-02-14 (×4): 40 mg via ORAL
  Filled 2015-02-11 (×4): qty 1

## 2015-02-11 MED ORDER — DEXTROSE 5 % IV SOLN
500.0000 mg | Freq: Once | INTRAVENOUS | Status: AC
  Administered 2015-02-11: 500 mg via INTRAVENOUS
  Filled 2015-02-11: qty 500

## 2015-02-11 MED ORDER — GABAPENTIN 300 MG PO CAPS
600.0000 mg | ORAL_CAPSULE | Freq: Every day | ORAL | Status: DC
  Administered 2015-02-11 – 2015-02-13 (×3): 600 mg via ORAL
  Filled 2015-02-11 (×3): qty 2

## 2015-02-11 MED ORDER — SODIUM CHLORIDE 0.9 % IV SOLN
Freq: Once | INTRAVENOUS | Status: AC
  Administered 2015-02-11: 11:00:00 via INTRAVENOUS

## 2015-02-11 MED ORDER — SODIUM CHLORIDE 0.9 % IV SOLN
INTRAVENOUS | Status: DC
  Administered 2015-02-11: 19:00:00 via INTRAVENOUS

## 2015-02-11 MED ORDER — DEXTROSE 5 % IV SOLN
1.0000 g | Freq: Once | INTRAVENOUS | Status: AC
  Administered 2015-02-11: 1 g via INTRAVENOUS
  Filled 2015-02-11: qty 10

## 2015-02-11 MED ORDER — ASPIRIN EC 81 MG PO TBEC
81.0000 mg | DELAYED_RELEASE_TABLET | Freq: Every day | ORAL | Status: DC
  Administered 2015-02-11 – 2015-02-14 (×4): 81 mg via ORAL
  Filled 2015-02-11 (×4): qty 1

## 2015-02-11 MED ORDER — CEFTRIAXONE SODIUM IN DEXTROSE 20 MG/ML IV SOLN
1.0000 g | INTRAVENOUS | Status: DC
  Administered 2015-02-12 – 2015-02-13 (×2): 1 g via INTRAVENOUS
  Filled 2015-02-11 (×3): qty 50

## 2015-02-11 MED ORDER — TAMSULOSIN HCL 0.4 MG PO CAPS
0.4000 mg | ORAL_CAPSULE | Freq: Every day | ORAL | Status: DC
  Administered 2015-02-11 – 2015-02-14 (×4): 0.4 mg via ORAL
  Filled 2015-02-11 (×4): qty 1

## 2015-02-11 MED ORDER — SODIUM CHLORIDE 0.9 % IV BOLUS (SEPSIS)
500.0000 mL | Freq: Once | INTRAVENOUS | Status: AC
  Administered 2015-02-11: 500 mL via INTRAVENOUS

## 2015-02-11 MED ORDER — DILTIAZEM HCL ER 120 MG PO CP24
120.0000 mg | ORAL_CAPSULE | Freq: Every day | ORAL | Status: DC
  Administered 2015-02-12 – 2015-02-14 (×3): 120 mg via ORAL
  Filled 2015-02-11 (×4): qty 1

## 2015-02-11 MED ORDER — GABAPENTIN 300 MG PO CAPS: 300.0000 mg | ORAL_CAPSULE | Freq: Four times a day (QID) | ORAL | Status: DC

## 2015-02-11 MED ORDER — MONTELUKAST SODIUM 10 MG PO TABS
10.0000 mg | ORAL_TABLET | Freq: Every evening | ORAL | Status: DC
  Administered 2015-02-11 – 2015-02-13 (×3): 10 mg via ORAL
  Filled 2015-02-11 (×4): qty 1

## 2015-02-11 MED ORDER — GUAIFENESIN-DM 100-10 MG/5ML PO SYRP
10.0000 mL | ORAL_SOLUTION | ORAL | Status: DC | PRN
  Administered 2015-02-12: 10 mL via ORAL
  Filled 2015-02-11: qty 10

## 2015-02-11 MED ORDER — FLUTICASONE PROPIONATE 50 MCG/ACT NA SUSP
2.0000 | Freq: Every day | NASAL | Status: DC
  Administered 2015-02-12 – 2015-02-14 (×3): 2 via NASAL
  Filled 2015-02-11: qty 16

## 2015-02-11 MED ORDER — ALBUTEROL SULFATE (2.5 MG/3ML) 0.083% IN NEBU
5.0000 mg | INHALATION_SOLUTION | Freq: Once | RESPIRATORY_TRACT | Status: AC
  Administered 2015-02-11: 5 mg via RESPIRATORY_TRACT
  Filled 2015-02-11: qty 6

## 2015-02-11 MED ORDER — IPRATROPIUM BROMIDE 0.02 % IN SOLN
0.5000 mg | Freq: Once | RESPIRATORY_TRACT | Status: AC
  Administered 2015-02-11: 0.5 mg via RESPIRATORY_TRACT
  Filled 2015-02-11: qty 2.5

## 2015-02-11 MED ORDER — TIZANIDINE HCL 4 MG PO TABS
4.0000 mg | ORAL_TABLET | Freq: Every evening | ORAL | Status: DC
  Administered 2015-02-11 – 2015-02-13 (×3): 4 mg via ORAL
  Filled 2015-02-11 (×4): qty 1

## 2015-02-11 MED ORDER — ENOXAPARIN SODIUM 40 MG/0.4ML ~~LOC~~ SOLN
40.0000 mg | SUBCUTANEOUS | Status: DC
  Administered 2015-02-11 – 2015-02-13 (×3): 40 mg via SUBCUTANEOUS
  Filled 2015-02-11 (×3): qty 0.4

## 2015-02-11 MED ORDER — GABAPENTIN 300 MG PO CAPS
300.0000 mg | ORAL_CAPSULE | Freq: Three times a day (TID) | ORAL | Status: DC
  Administered 2015-02-11 – 2015-02-14 (×9): 300 mg via ORAL
  Filled 2015-02-11 (×11): qty 1

## 2015-02-11 MED ORDER — NITROGLYCERIN 0.4 MG SL SUBL
0.4000 mg | SUBLINGUAL_TABLET | SUBLINGUAL | Status: DC | PRN
  Administered 2015-02-11 (×2): 0.4 mg via SUBLINGUAL
  Filled 2015-02-11: qty 1

## 2015-02-11 MED ORDER — CELECOXIB 200 MG PO CAPS
200.0000 mg | ORAL_CAPSULE | Freq: Every day | ORAL | Status: DC
  Administered 2015-02-12 – 2015-02-14 (×3): 200 mg via ORAL
  Filled 2015-02-11 (×3): qty 1

## 2015-02-11 MED ORDER — IPRATROPIUM-ALBUTEROL 0.5-2.5 (3) MG/3ML IN SOLN
3.0000 mL | Freq: Four times a day (QID) | RESPIRATORY_TRACT | Status: DC
  Administered 2015-02-11 – 2015-02-13 (×7): 3 mL via RESPIRATORY_TRACT
  Filled 2015-02-11 (×7): qty 3

## 2015-02-11 MED ORDER — ALBUTEROL SULFATE (2.5 MG/3ML) 0.083% IN NEBU: 5.0000 mg | INHALATION_SOLUTION | RESPIRATORY_TRACT | Status: DC | PRN

## 2015-02-11 MED ORDER — METHYLPREDNISOLONE SODIUM SUCC 125 MG IJ SOLR
60.0000 mg | Freq: Two times a day (BID) | INTRAMUSCULAR | Status: DC
  Administered 2015-02-11 – 2015-02-14 (×6): 60 mg via INTRAVENOUS
  Filled 2015-02-11 (×7): qty 0.96

## 2015-02-11 MED ORDER — ALBUTEROL SULFATE (2.5 MG/3ML) 0.083% IN NEBU: 2.5000 mg | INHALATION_SOLUTION | RESPIRATORY_TRACT | Status: DC | PRN

## 2015-02-11 MED ORDER — ACETAMINOPHEN 325 MG PO TABS
650.0000 mg | ORAL_TABLET | Freq: Four times a day (QID) | ORAL | Status: DC | PRN
  Administered 2015-02-11 – 2015-02-13 (×3): 650 mg via ORAL
  Filled 2015-02-11 (×4): qty 2

## 2015-02-11 NOTE — ED Notes (Signed)
patient returned from XR

## 2015-02-11 NOTE — ED Notes (Signed)
Spoke with [name], Consulting civil engineercharge RN. Stated to call back in 5 minutes for new room #.

## 2015-02-11 NOTE — H&P (Addendum)
History and Physical  Jesse Scott ZOX:[id number] DOB: [birth year] DOA: 02/11/2015  Referring physician: Dr. Cathren Laine, EDP PCP: No primary care provider on file.  Dr. Marlou Porch in [place], [place]. Outpatient Specialists:  1. None  Chief Complaint: Cough.  HPI: Jesse Scott is a 74 y.o. male from [place]-non-English-speaking, has been living in the [place] for the last 20 years, resides with his wife in [place], [place], visiting his son in [place] for the last week, PMH of HTN, HLD, GERD, COPD on chronic prednisone, CAD status post remote stent, not on home oxygen, presented to the [facility] ED on 02/11/15 with 1 day history of cough, dyspnea and chills. Patient's son at bedside speaks [place] and is the interpreter. Patient apparently had been feeling intermittently weak for the last week until last night. Since last night patient started having cough productive of yellow sputum, throat pain, chest tightness with wheezing, dyspnea, chills but denied fevers, generalized aches including headache and body pains. Patient denies sickly contacts or recent travel. Denies chest pain. No history of dysphagia. In the ED, noted to be febrile at 101.28F, hypoxic at 88% on room air, lab work significant for sodium 133, glucose 65, platelets 137 and chest x-ray suggestive of mild bilateral lower lobe pneumonia and mild bronchitic changes. Hospitalist admission requested.   Review of Systems: All systems reviewed and apart from history of presenting illness, are negative.  Past Medical History  Diagnosis Date  . Arthritis   . Hypertension   . High cholesterol   . GERD (gastroesophageal reflux disease)   . COPD (chronic obstructive pulmonary disease)   . CAD (coronary artery disease)     stent ? 2007   Past Surgical History  Procedure Laterality Date  . Hernia repair    . Eye surgery      False right eye  . Coronary angioplasty with stent placement     Social History:  reports that he has  never smoked. He does not have any smokeless tobacco history on file. He reports that he does not drink alcohol or use illicit drugs. Married. Independent of activities of daily living. Patient and spouse apparently used to work in [employer] exposed to chemicals of some sort. Not on home oxygen.  No Known Allergies  Family History  Problem Relation Age of Onset  . Family history unknown: Yes   Interviewed regarding family history but according to son, patient does not know because all of his family members have died.  Prior to Admission medications   Medication Sig Start Date End Date Taking? Authorizing Provider  albuterol (PROVENTIL) (2.5 MG/3ML) 0.083% nebulizer solution Take 2.5 mg by nebulization every 6 (six) hours as needed for wheezing or shortness of breath.   Yes Historical Provider, MD  alendronate (FOSAMAX) 70 MG tablet Take 70 mg by mouth every Saturday. Take with a full glass of water on an empty stomach.   Yes Historical Provider, MD  aspirin EC 81 MG tablet Take 81 mg by mouth daily.   Yes Historical Provider, MD  celecoxib (CELEBREX) 200 MG capsule Take 200 mg by mouth daily.   Yes Historical Provider, MD  diltiazem (DILACOR XR) 120 MG 24 hr capsule Take 120 mg by mouth daily.   Yes Historical Provider, MD  DM-Doxylamine-Acetaminophen 15-6.25-325 MG/15ML LIQD Take 1 capsule by mouth at bedtime as needed (for sleep and cold).   Yes Historical Provider, MD  esomeprazole (NEXIUM) 20 MG capsule Take 20 mg by mouth daily at 12  noon.   Yes Historical Provider, MD  fluticasone (FLONASE) 50 MCG/ACT nasal spray Place 2 sprays into both nostrils daily.  11/01/13  Yes Historical Provider, MD  gabapentin (NEURONTIN) 300 MG capsule Take 300-600 mg by mouth 4 (four) times daily. Takes 300mg  at 7am 12pm and 7pm and 600mg  before bedtime 12/26/13  Yes Historical Provider, MD  mometasone-formoterol (DULERA) 100-5 MCG/ACT AERO Inhale 2 puffs into the lungs 2 (two) times daily.   Yes Historical  Provider, MD  nitroGLYCERIN (NITROSTAT) 0.4 MG SL tablet Place 0.4 mg under the tongue every 5 (five) minutes as needed for chest pain.   Yes Historical Provider, MD  prednisoLONE 5 MG TABS tablet Take 5 mg by mouth daily.   Yes Historical Provider, MD  rosuvastatin (CRESTOR) 20 MG tablet Take 20 mg by mouth daily.   Yes Historical Provider, MD  SINGULAIR 10 MG tablet Take 10 mg by mouth every evening.  11/17/13  Yes Historical Provider, MD  tamsulosin (FLOMAX) 0.4 MG CAPS capsule Take 0.4 mg by mouth daily.  01/17/14  Yes Historical Provider, MD  tiZANidine (ZANAFLEX) 4 MG tablet Take 4 mg by mouth every evening.  11/04/13  Yes Historical Provider, MD   Physical Exam: Filed Vitals:   02/11/15 1026 02/11/15 1027 02/11/15 1030 02/11/15 1144  BP:   140/62   Pulse: 83 89 81   Temp: 101.2 F (38.4 C)   99.8 F (37.7 C)  TempSrc: Rectal   Oral  Resp: 25 17 24    SpO2:  95% 95%      General exam: Moderately built and nourished pleasant young male patient, lying comfortably supine on the gurney in no obvious distress.  Head, eyes and ENT: Nontraumatic and normocephalic. Left pupil 3 mm reacting to light. Mature left cataract. False right eye. Oral mucosa dry.  Neck: Supple. No JVD, carotid bruit or thyromegaly.  Lymphatics: No lymphadenopathy.  Respiratory system: Reduced breath sounds bilaterally with scattered bilateral medium pitched few expiratory rhonchi. Occasional basal crackles. No increased work of breathing. Able to speak in full sentences.  Cardiovascular system: S1 and S2 heard, RRR. No JVD, murmurs, gallops, clicks or pedal edema.  Gastrointestinal system: Abdomen is nondistended, soft and nontender. Normal bowel sounds heard. No organomegaly or masses appreciated.  Central nervous system: Alert and oriented. No focal neurological deficits.  Extremities: Symmetric 5 x 5 power. Peripheral pulses symmetrically felt.   Skin: No rashes or acute findings.  Musculoskeletal  system: Negative exam.  Psychiatry: Pleasant and cooperative.   Labs on Admission:  Basic Metabolic Panel:  Recent Labs Lab 02/11/15 0920  NA 133*  K 3.5  CL 103  CO2 27  GLUCOSE 65*  BUN 12  CREATININE 1.21  CALCIUM 7.9*   Liver Function Tests: No results for input(s): AST, ALT, ALKPHOS, BILITOT, PROT, ALBUMIN in the last 168 hours. No results for input(s): LIPASE, AMYLASE in the last 168 hours. No results for input(s): AMMONIA in the last 168 hours. CBC:  Recent Labs Lab 02/11/15 0920  WBC 6.5  HGB 13.2  HCT 40.2  MCV 80.2  PLT 137*   Cardiac Enzymes: No results for input(s): CKTOTAL, CKMB, CKMBINDEX, TROPONINI in the last 168 hours.  BNP (last 3 results) No results for input(s): PROBNP in the last 8760 hours. CBG: No results for input(s): GLUCAP in the last 168 hours.  Radiological Exams on Admission: Dg Chest 2 View  02/11/2015   CLINICAL DATA:  Productive cough and fever for the past 2 days.  EXAM: CHEST  2 VIEW  COMPARISON:  None.  FINDINGS: Normal sized heart. Mild patchy opacity in both lower lobes. Mild diffuse peribronchial thickening and accentuation of the interstitial markings. Diffuse osteopenia.  IMPRESSION: 1. Mild bilateral lower lobe pneumonia. 2. Mild bronchitic changes.   Electronically Signed   By: Beckie Salts M.D.   On: 02/11/2015 10:31    EKG: None seen in Epic- will order.  Assessment/Plan  74 y.o. male from [place]-non-English-speaking, has been living in the [place] for the last 20 years, resides with his wife in [place], [place], visiting his son in [place] for the last week, PMH of HTN, HLD, GERD, COPD on chronic prednisone, CAD status post remote stent, not on home oxygen, presented to the [facility] ED on 02/11/15 with 1 day history of cough, dyspnea and chills. Patient admitted for presumed bilateral lower lobe community-acquired pneumonia, COPD exacerbation and acute respiratory failure with hypoxia.   Principal Problem:    CAP (community acquired pneumonia) - Follow up outstanding cultures: Blood and urine - Check urine Legionella and streptococcal antigen - Check HIV antibody screen - Placed on droplet isolation until flu panel PCR is checked and negative - Continue empiric IV Rocephin and azithromycin - Lead follow-up chest x-ray in 4-6 weeks to ensure resolution of pneumonia findings  Active Problems:   COPD exacerbation - Precipitated by acute bronchitis and pneumonia - Treat with oxygen, bronchodilators, IV Solu-Medrol and antibiotics as above - On chronic prednisone at home    Acute respiratory failure with hypoxia - Oxygen saturations 88% on room air. - Secondary to pneumonia and COPD exacerbation-management as above    Hypertension - Controlled - Continue home dose of diltiazem    High cholesterol - Continue statins    GERD (gastroesophageal reflux disease) - Continue PPI    Dehydration - Brief IV fluids    Thrombocytopenia - Seems chronic. - Low CBCs   Chest tightness - Atypical for ischemic type of pain - Possibly from pneumonia and COPD exacerbation - POC troponin 1 negative - We will check EKG and cycle troponin - Continue aspirin and when necessary sublingual NTG   History of CAD with stent - Management as above   Hypoglycemia - Seen on BMP in ED - We'll verify by CBG - Diet and monitor closely.   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 Dysuria. - Continue Flomax - We'll check urine microscopy.    Code Status: Full  Family Communication: Discussed with patient's son and spouse at bedside.  Disposition Plan: Home when medically stable, possibly in 3-4 days   Time spent: 65 minutes  Jesse Bures, MD, FACP, FHM. [facility] Pager [phone]  If 7PM-7AM, please contact night-coverage [email or web address] Password [facility] 02/11/2015, 12:39 PM

## 2015-02-11 NOTE — Progress Notes (Signed)
Received call from pt's room that pt c/o chest pain. Pt reported that pain was "different" but not radiating from chest. Administered NTG SL x2 and obtained EKG. Hospitalist notified and is at pt bedside.

## 2015-02-11 NOTE — ED Notes (Signed)
He c/o "chills; cough and 'tightness in my chest and throat'" since yesterday.  He is in no distress.  Congested cough noted.  EKG performed at triage.

## 2015-02-11 NOTE — ED Provider Notes (Addendum)
CSN: [id number]     Arrival date & time 02/11/15  [id number] History   First MD Initiated Contact with Patient 02/11/15 0913     Chief Complaint  Patient presents with  . URI     (Consider location/radiation/quality/duration/timing/severity/associated sxs/prior Treatment) Patient is a 74 y.o. male presenting with URI. The history is provided by the patient.  URI Presenting symptoms: congestion and cough   Presenting symptoms: no sore throat   Associated symptoms: wheezing   Associated symptoms: no headaches and no neck pain   Patient w hx copd, c/o non prod cough, chills, subj fever, nasal congestion for the past day. Cough episodic, persistent. Mild sob/wheezing. At baseline, uses mdi daily. No known ill contacts or recent travel. Did have flu shot this year. No headache. No body aches. No abd pain. No nvd. No gu c/o. No swelling.      Past Medical History  Diagnosis Date  . Arthritis   . Hypertension   . High cholesterol   . GERD (gastroesophageal reflux disease)   . COPD (chronic obstructive pulmonary disease)    Past Surgical History  Procedure Laterality Date  . Hernia repair    . Eye surgery    . Coronary angioplasty with stent placement     No family history on file. History  Substance Use Topics  . Smoking status: Never Smoker   . Smokeless tobacco: Not on file  . Alcohol Use: No    Review of Systems  Constitutional: Positive for chills.  HENT: Positive for congestion. Negative for sore throat.   Eyes: Negative for redness.  Respiratory: Positive for cough, shortness of breath and wheezing.   Cardiovascular: Negative for chest pain.  Gastrointestinal: Negative for vomiting, abdominal pain and diarrhea.  Endocrine: Negative for polyuria.  Genitourinary: Negative for dysuria and flank pain.  Musculoskeletal: Negative for back pain and neck pain.  Skin: Negative for rash.  Neurological: Negative for headaches.  Hematological: Does not bruise/bleed easily.   Psychiatric/Behavioral: Negative for confusion.      Allergies  Review of patient's allergies indicates no known allergies.  Home Medications   Prior to Admission medications   Medication Sig Start Date End Date Taking? Authorizing Provider  alendronate (FOSAMAX) 70 MG tablet Take 70 mg by mouth once a week. Take with a full glass of water on an empty stomach. Take on Mondays    Historical Provider, MD  aspirin EC 81 MG tablet Take 81 mg by mouth daily.    Historical Provider, MD  CRESTOR 20 MG tablet Take 20 mg by mouth daily.  12/26/13   Historical Provider, MD  DULERA 100-5 MCG/ACT AERO Inhale 2 puffs into the lungs 2 (two) times daily.  01/09/14   Historical Provider, MD  fluticasone (FLONASE) 50 MCG/ACT nasal spray Place 1 spray into both nostrils daily.  11/01/13   Historical Provider, MD  gabapentin (NEURONTIN) 300 MG capsule Take 300-600 mg by mouth 4 (four) times daily. Patient takes 7am 12pm and 7pm and 600mg  before bedtime 12/26/13   Historical Provider, MD  naproxen (NAPROSYN) 500 MG tablet Take 1 tablet (500 mg total) by mouth 2 (two) times daily with a meal. 08/05/14   Abigail Harris, PA-C  NEXIUM 40 MG capsule Take 40 mg by mouth daily at 12 noon.  11/17/13   Historical Provider, MD  nitroGLYCERIN (NITROSTAT) 0.4 MG SL tablet Place 0.4 mg under the tongue every 5 (five) minutes as needed for chest pain.    Historical Provider, MD  prednisoLONE  5 MG TABS tablet Take 5 mg by mouth daily.    Historical Provider, MD  SINGULAIR 10 MG tablet Take 10 mg by mouth at bedtime.  11/17/13   Historical Provider, MD  tamsulosin (FLOMAX) 0.4 MG CAPS capsule Take 0.4 mg by mouth daily after breakfast.  01/17/14   Historical Provider, MD  tiZANidine (ZANAFLEX) 4 MG tablet Take 4 mg by mouth every evening.  11/04/13   Historical Provider, MD  traMADol (ULTRAM-ER) 200 MG 24 hr tablet Take 200 mg by mouth daily.  10/28/13   Historical Provider, MD  VENTOLIN HFA 108 (90 BASE) MCG/ACT inhaler Inhale 1 puff  into the lungs every 4 (four) hours as needed for wheezing.  11/28/13   Historical Provider, MD   BP 137/85 mmHg  Pulse 82  Temp(Src) 98.4 F (36.9 C) (Oral)  Resp 17  SpO2 95% Physical Exam  Constitutional: He is oriented to person, place, and time. He appears well-developed and well-nourished. No distress.  HENT:  Nose: Nose normal.  Mouth/Throat: Oropharynx is clear and moist.  Eyes: Conjunctivae are normal. No scleral icterus.  Neck: Neck supple. No tracheal deviation present.  No stiffness or rigidity  Cardiovascular: Normal rate, regular rhythm, normal heart sounds and intact distal pulses.  Exam reveals no gallop and no friction rub.   No murmur heard. Pulmonary/Chest: Effort normal. No accessory muscle usage. No respiratory distress. He has wheezes.  Abdominal: Soft. Bowel sounds are normal. He exhibits no distension. There is no tenderness.  Genitourinary:  No cva tenderness  Musculoskeletal: Normal range of motion. He exhibits no edema or tenderness.  Neurological: He is alert and oriented to person, place, and time.  Skin: Skin is warm and dry. He is not diaphoretic.  Psychiatric: He has a normal mood and affect.  Nursing note and vitals reviewed.   ED Course  Procedures (including critical care time) Labs Review  Results for orders placed or performed during the hospital encounter of 02/11/15  CBC  Result Value Ref Range   WBC 6.5 4.0 - 10.5 K/uL   RBC 5.01 4.22 - 5.81 MIL/uL   Hemoglobin 13.2 13.0 - 17.0 g/dL   HCT 16.1 09.6 - 04.5 %   MCV 80.2 78.0 - 100.0 fL   MCH 26.3 26.0 - 34.0 pg   MCHC 32.8 30.0 - 36.0 g/dL   RDW 40.9 (H) 81.1 - 91.4 %   Platelets 137 (L) 150 - 400 K/uL  Basic metabolic panel  Result Value Ref Range   Sodium 133 (L) 135 - 145 mmol/L   Potassium 3.5 3.5 - 5.1 mmol/L   Chloride 103 96 - 112 mmol/L   CO2 27 19 - 32 mmol/L   Glucose, Bld 65 (L) 70 - 99 mg/dL   BUN 12 6 - 23 mg/dL   Creatinine, Ser 7.82 0.50 - 1.35 mg/dL   Calcium  7.9 (L) 8.4 - 10.5 mg/dL   GFR calc non Af Amer 58 (L) >90 mL/min   GFR calc Af Amer 67 (L) >90 mL/min   Anion gap 3 (L) 5 - 15  I-stat troponin, ED (not at [facility]  Result Value Ref Range   Troponin i, poc 0.01 0.00 - 0.08 ng/mL   Comment 3          I-Stat CG4 Lactic Acid, ED  Result Value Ref Range   Lactic Acid, Venous 0.70 0.5 - 2.0 mmol/L   Dg Chest 2 View  02/11/2015   CLINICAL DATA:  Productive cough and fever  for the past 2 days.  EXAM: CHEST  2 VIEW  COMPARISON:  None.  FINDINGS: Normal sized heart. Mild patchy opacity in both lower lobes. Mild diffuse peribronchial thickening and accentuation of the interstitial markings. Diffuse osteopenia.  IMPRESSION: 1. Mild bilateral lower lobe pneumonia. 2. Mild bronchitic changes.   Electronically Signed   By: Beckie Salts M.D.   On: 02/11/2015 10:31       MDM   Iv ns. Labs. Cxr.  Reviewed nursing notes and prior charts for additional history.   Albuterol and atrovent neb.  Exam and cxr c/w pna.   Rocephin and zithromax iv.  Will also add flu panel to labs.  Given bil infil/pna, wheezing/sob, hypoxia on room air w pulse ox 88%, will admit for obs/tx.  Medical service consulted for admission.     Cathren Laine, MD 02/11/15 1049

## 2015-02-11 NOTE — Progress Notes (Signed)
Per MD request CBG obtained and result CBG 188.  MD paged with results. Hager Compston A

## 2015-02-11 NOTE — ED Notes (Signed)
RT called

## 2015-02-11 NOTE — ED Notes (Signed)
Patient transported to X-ray 

## 2015-02-11 NOTE — ED Notes (Signed)
Pt alert and oriented x4. Respirations even and unlabored, bilateral symmetrical rise and fall of chest. Skin warm and dry. In no acute distress. Denies needs.   

## 2015-02-11 NOTE — Progress Notes (Signed)
[facility] progress note. Chief complaint. Chest pain. History of present illness. This 74 year old male admitted with community-acquired pneumonia and COPD exacerbation. Patient does have a history of coronary artery disease and? Stent placement in 2007. Patient complained of chest pain earlier today and an EKG was obtained. This EKG showed ST elevation in V2 through 5. I reviewed this per prior EKG in the computer system and this does not appear significantly different. Patient now again complains of chest pain and the repeat EKG was obtained. This of EKG appears unchanged with continued ST elevation in V2 through 5. Troponins have been monitored and normal 2 results. I came to see the patient at bedside and found him alert and in no distress. He speaks no [place] but his family acted as Equities traderinterpreter. Patient indicates pain under the left breast and left lateral ribs. Physical exam. Vital signs. Temperature 98.9, pulse 70, respiration 16, blood pressure 109/65. O2 sats 96%. General appearance. Frail elderly male who is alert and in no distress. Cardiac. Rate and rhythm regular. Occasional irregular beat. Lungs. Breath sounds are coarse with rhonchi. No distress and stable O2 sats. Abdomen. Soft with positive bowel sounds. No pain. Musculoskeletal. Patient does have pain with palpation under the left breast and left lateral ribs. This suggestive possible costochondritis secondary to coughing. Impression/plan. Problem #1. Chest pain. EKG does show some ST elevation in V2-5 though this appears old when compared to prior EKG. Troponins have been negative so far we'll continue to follow. Pain with palpation suggestive possible costochondritis.

## 2015-02-12 DIAGNOSIS — D696 Thrombocytopenia, unspecified: Secondary | ICD-10-CM

## 2015-02-12 LAB — BASIC METABOLIC PANEL
Anion gap: 5 (ref 5–15)
BUN: 18 mg/dL (ref 6–23)
CALCIUM: 7.8 mg/dL — AB (ref 8.4–10.5)
CHLORIDE: 109 mmol/L (ref 96–112)
CO2: 24 mmol/L (ref 19–32)
Creatinine, Ser: 1.01 mg/dL (ref 0.50–1.35)
GFR calc Af Amer: 83 mL/min — ABNORMAL LOW (ref >90)
GFR, EST NON AFRICAN AMERICAN: 72 mL/min — AB (ref >90)
Glucose, Bld: 155 mg/dL — ABNORMAL HIGH (ref 70–99)
Potassium: 3.7 mmol/L (ref 3.5–5.1)
SODIUM: 138 mmol/L (ref 135–145)

## 2015-02-12 LAB — TROPONIN I
Troponin I: <0.03 ng/mL (ref <0.031)
Troponin I: <0.03 ng/mL (ref <0.031)
Troponin I: <0.03 ng/mL (ref <0.031)

## 2015-02-12 LAB — CBC
HCT: 38.9 % — ABNORMAL LOW (ref 39.0–52.0)
HEMOGLOBIN: 12.9 g/dL — AB (ref 13.0–17.0)
MCH: 26.4 pg (ref 26.0–34.0)
MCHC: 33.2 g/dL (ref 30.0–36.0)
MCV: 79.6 fL (ref 78.0–100.0)
Platelets: 137 10*3/uL — ABNORMAL LOW (ref 150–400)
RBC: 4.89 MIL/uL (ref 4.22–5.81)
RDW: 15.6 % — ABNORMAL HIGH (ref 11.5–15.5)
WBC: 8.7 10*3/uL (ref 4.0–10.5)

## 2015-02-12 LAB — GLUCOSE, CAPILLARY
Glucose-Capillary: 161 mg/dL — ABNORMAL HIGH (ref 70–99)
Glucose-Capillary: 160 mg/dL — ABNORMAL HIGH (ref 70–99)
Glucose-Capillary: 247 mg/dL — ABNORMAL HIGH (ref 70–99)

## 2015-02-12 LAB — INFLUENZA PANEL BY PCR (TYPE A & B)
H1N1 flu by pcr: NOT DETECTED
Influenza A By PCR: NEGATIVE
Influenza B By PCR: NEGATIVE

## 2015-02-12 LAB — HIV ANTIBODY (ROUTINE TESTING W REFLEX): HIV Screen 4th Generation wRfx: NONREACTIVE

## 2015-02-12 MED ORDER — MENTHOL 3 MG MT LOZG
1.0000 | LOZENGE | OROMUCOSAL | Status: DC | PRN
  Filled 2015-02-12: qty 9

## 2015-02-12 MED ORDER — AZITHROMYCIN 500 MG PO TABS
500.0000 mg | ORAL_TABLET | Freq: Every day | ORAL | Status: DC
  Administered 2015-02-13 – 2015-02-14 (×2): 500 mg via ORAL
  Filled 2015-02-12 (×2): qty 1

## 2015-02-12 MED ORDER — INSULIN ASPART 100 UNIT/ML ~~LOC~~ SOLN
0.0000 [IU] | Freq: Three times a day (TID) | SUBCUTANEOUS | Status: DC
  Administered 2015-02-12 – 2015-02-13 (×2): 2 [IU] via SUBCUTANEOUS
  Administered 2015-02-13: 1 [IU] via SUBCUTANEOUS
  Administered 2015-02-13: 3 [IU] via SUBCUTANEOUS

## 2015-02-12 NOTE — Progress Notes (Signed)
This patient is receiving the antibiotic Azithromycin by the intravenous route. Based on criteria approved by the Pharmacy and Therapeutics Committee, and the Infectious Disease Division, the antibiotic(s) is / are being converted to equivalent oral dose form(s). These criteria include: . Patient being treated for a respiratory tract infection, urinary tract infection, cellulitis, or Clostridium Difficile Associated Diarrhea . The patient is not neutropenic and does not exhibit a GI malabsorption state . The patient is eating (either orally or per tube) and/or has been taking other orally administered medications for at least 24 hours. . The patient is improving clinically (physician assessment and a 24-hour Tmax of < 100.5 F).  If you have questions about this conversion, please contact the pharmacy department. Thank you.  Clance BollAmanda Kesleigh Morson, PharmD, BCPS Pager: [phone] 02/12/2015 1:33 PM

## 2015-02-12 NOTE — Plan of Care (Signed)
Problem: Phase II Progression Outcomes Goal: Wean O2 if indicated Outcome: Progressing Pt decreased to 1 L and sat maintained at 96%.

## 2015-02-12 NOTE — Progress Notes (Signed)
PROGRESS NOTE    Jesse Scott EAV:[id number] DOB: 04/04/1941 DOA: 02/11/2015 PCP: No primary care provider on file. Dr. Marlou Porch in [place], [place].  HPI/Brief narrative 74 y.o. male from [place]-non-English-speaking, has been living in the [place] for the last 20 years, resides with his wife in [place], [place], visiting his son in [place] for the last week, PMH of HTN, HLD, GERD, COPD on chronic prednisone, CAD status post remote stent, not on home oxygen, presented to the [facility] ED on 02/11/15 with 1 day history of cough, dyspnea and chills. Patient admitted for presumed bilateral lower lobe community-acquired pneumonia, COPD exacerbation and acute respiratory failure with hypoxia.   Assessment/Plan:  Principal Problem:  CAP (community acquired pneumonia) - Follow up outstanding cultures: Blood and sputum: Pending - Check urine Legionella (pending) and streptococcal antigen (negative) - Check HIV antibody screen (pending) - Flu panel PCR: Negative. DC droplet isolation - Continue empiric IV Rocephin and azithromycin - follow-up chest x-ray in 4-6 weeks to ensure resolution of pneumonia findings - Improving  Active Problems:  COPD exacerbation - Precipitated by acute bronchitis and pneumonia - Treat with oxygen, bronchodilators, IV Solu-Medrol and antibiotics as above - On chronic prednisone at home   Acute respiratory failure with hypoxia - Oxygen saturations 88% on room air. - Secondary to pneumonia and COPD exacerbation-management as above - Titrate oxygen down to maintain saturations greater than 90%.   Hypertension - Controlled - Continue home dose of diltiazem   High cholesterol - Continue statins   GERD (gastroesophageal reflux disease) - Continue PPI   Dehydration - Brief IV fluids - Resolved. Encourage oral intake   Thrombocytopenia - Seems chronic. - Low CBCs - Stable  Chest tightness - Atypical for ischemic type of pain -  Possibly from pneumonia and COPD exacerbation - Troponin cycle 4 and negative - EKG 02/11/15: Sinus rhythm at 72 bpm, normal axis, LVH, Q waves in leads 3 and aVF. Diffuse elevated J point rather than ST elevation. QTC 429 ms - Continue aspirin and when necessary sublingual NTG - Chest discomfort resolved  History of CAD with stent - Management as above  Hypoglycemia/hyperglycemia - Seen on BMP in ED - Hyperglycemia likely secondary to steroids. Check A1c and place on SSI.  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 Dysuria. - Continue Flomax - Urine microscopy: No UTI features.     Code Status: Full  Family Communication: Discussed with patient's son and spouse at bedside.  Disposition Plan: Home when medically stable, possibly in 2-3 days   Consultants:  None  Procedures:  None  Antibiotics:  IV Rocephin 4/24 >  IV azithromycin 4/24 >   Subjective: Patient's son at bedside interpreted. Patient feels better overall with improved dyspnea, no chest discomfort. Still has throat pain.  Objective: Filed Vitals:   02/12/15 0852 02/12/15 1054 02/12/15 1210 02/12/15 1257  BP: 137/62 137/59  136/63  Pulse: 77 70  76  Temp: 97.8 F (36.6 C)   97.9 F (36.6 C)  TempSrc: Oral   Oral  Resp:    20  Height:      Weight:      SpO2: 97% 96% 96% 94%    Intake/Output Summary (Last 24 hours) at 02/12/15 1258 Last data filed at 02/12/15 1257  Gross per 24 hour  Intake 2442.5 ml  Output      0 ml  Net 2442.5 ml   Filed Weights   02/11/15 1309  Weight:  56.4 kg (124 lb 5.4 oz)     Exam:  General exam: Pleasant elderly male sitting up comfortably in bed Respiratory system: Much improved breath sounds compared to admission. Occasional bilateral rhonchi. No increased work of breathing. Cardiovascular system: S1 & S2 heard, RRR. No JVD, murmurs, gallops, clicks or pedal edema. Telemetry: Sinus rhythm with first-degree AV  block and occasional PVCs Gastrointestinal system: Abdomen is nondistended, soft and nontender. Normal bowel sounds heard. Central nervous system: Alert and oriented. No focal neurological deficits. Extremities: Symmetric 5 x 5 power.   Data Reviewed: Basic Metabolic Panel:  Recent Labs Lab 02/11/15 0920 02/12/15 0630  NA 133* 138  K 3.5 3.7  CL 103 109  CO2 27 24  GLUCOSE 65* 155*  BUN 12 18  CREATININE 1.21 1.01  CALCIUM 7.9* 7.8*   Liver Function Tests: No results for input(s): AST, ALT, ALKPHOS, BILITOT, PROT, ALBUMIN in the last 168 hours. No results for input(s): LIPASE, AMYLASE in the last 168 hours. No results for input(s): AMMONIA in the last 168 hours. CBC:  Recent Labs Lab 02/11/15 0920 02/12/15 0630  WBC 6.5 8.7  HGB 13.2 12.9*  HCT 40.2 38.9*  MCV 80.2 79.6  PLT 137* 137*   Cardiac Enzymes:  Recent Labs Lab 02/11/15 1248 02/11/15 1900 02/12/15 0042 02/12/15 0630  TROPONINI <0.03 <0.03 <0.03 <0.03   BNP (last 3 results) No results for input(s): PROBNP in the last 8760 hours. CBG:  Recent Labs Lab 02/11/15 1656 02/11/15 1825 02/11/15 2133 02/12/15 0743  GLUCAP 241* 188* 189* 161*    Recent Results (from the past 240 hour(s))  Culture, expectorated sputum-assessment     Status: None   Collection Time: 02/11/15 10:48 PM  Result Value Ref Range Status   Specimen Description SPUTUM  Final   Special Requests Normal  Final   Sputum evaluation   Final    THIS SPECIMEN IS ACCEPTABLE. RESPIRATORY CULTURE REPORT TO FOLLOW.   Report Status 02/11/2015 FINAL  Final      1.      Studies: Dg Chest 2 View  02/11/2015   CLINICAL DATA:  Productive cough and fever for the past 2 days.  EXAM: CHEST  2 VIEW  COMPARISON:  None.  FINDINGS: Normal sized heart. Mild patchy opacity in both lower lobes. Mild diffuse peribronchial thickening and accentuation of the interstitial markings. Diffuse osteopenia.  IMPRESSION: 1. Mild bilateral lower lobe  pneumonia. 2. Mild bronchitic changes.   Electronically Signed   By: Beckie Salts M.D.   On: 02/11/2015 10:31        Scheduled Meds: . antiseptic oral rinse  7 mL Mouth Rinse BID  . aspirin EC  81 mg Oral Daily  . azithromycin  500 mg Intravenous Q24H  . cefTRIAXone (ROCEPHIN)  IV  1 g Intravenous Q24H  . celecoxib  200 mg Oral Daily  . diltiazem  120 mg Oral Daily  . enoxaparin (LOVENOX) injection  40 mg Subcutaneous Q24H  . fluticasone  2 spray Each Nare Daily  . gabapentin  300 mg Oral TID  . gabapentin  600 mg Oral QHS  . guaiFENesin  1,200 mg Oral BID  . ipratropium-albuterol  3 mL Nebulization QID  . methylPREDNISolone (SOLU-MEDROL) injection  60 mg Intravenous Q12H  . mometasone-formoterol  2 puff Inhalation BID  . montelukast  10 mg Oral QPM  . pantoprazole  40 mg Oral Daily  . rosuvastatin  20 mg Oral Daily  . sodium chloride  3 mL Intravenous  Q12H  . tamsulosin  0.4 mg Oral Daily  . tiZANidine  4 mg Oral QPM   Continuous Infusions:   Principal Problem:   CAP (community acquired pneumonia) Active Problems:   COPD exacerbation   Acute respiratory failure with hypoxia   Hypertension   High cholesterol   GERD (gastroesophageal reflux disease)   Dehydration   Thrombocytopenia    Time spent: 25 minutes.    Marcellus ScottHONGALGI,Grecia Lynk, MD, FACP, FHM. [facility] Pager [phone]  If 7PM-7AM, please contact night-coverage [email or web address] Password [email or web address] 02/12/2015, 12:58 PM    LOS: 1 day

## 2015-02-13 LAB — LEGIONELLA ANTIGEN, URINE

## 2015-02-13 LAB — GLUCOSE, CAPILLARY
GLUCOSE-CAPILLARY: 135 mg/dL — AB (ref 70–99)
GLUCOSE-CAPILLARY: 234 mg/dL — AB (ref 70–99)
GLUCOSE-CAPILLARY: 180 mg/dL — AB (ref 70–99)
GLUCOSE-CAPILLARY: 200 mg/dL — AB (ref 70–99)

## 2015-02-13 LAB — HEMOGLOBIN A1C
HEMOGLOBIN A1C: 6.2 % — AB (ref 4.8–5.6)
Mean Plasma Glucose: 131 mg/dL

## 2015-02-13 MED ORDER — IPRATROPIUM-ALBUTEROL 0.5-2.5 (3) MG/3ML IN SOLN
3.0000 mL | Freq: Three times a day (TID) | RESPIRATORY_TRACT | Status: DC
  Administered 2015-02-13 – 2015-02-14 (×3): 3 mL via RESPIRATORY_TRACT
  Filled 2015-02-13 (×2): qty 3

## 2015-02-13 NOTE — Care Management Note (Addendum)
    Page 1 of 1   02/14/2015     1:50:35 PM CARE MANAGEMENT NOTE 02/14/2015  [facility]   Account Number:  [id number]  Date Initiated:  02/13/2015  Documentation initiated by:  Lanier ClamMAHABIR,Rossie Bretado  Subjective/Objective Assessment:   74 y/o m admitted w/PNA.     Action/Plan:   From home-Speaks [place]ian.   Anticipated DC Date:  02/14/2015   Anticipated DC Plan:  HOME/SELF CARE  In-house referral  Interpreting Services      DC Planning Services  CM consult      Choice offered to / List presented to:             Status of service:  Completed, signed off Medicare Important Message given?  YES (If response is "NO", the following Medicare IM given date fields will be blank) Date Medicare IM given:  02/14/2015 Medicare IM given by:  [facility]Vannessa [place] Date Additional Medicare IM given:   Additional Medicare IM given by:    Discharge Disposition:  HOME/SELF CARE  Per UR Regulation:  Reviewed for med. necessity/level of care/duration of stay  If discussed at Long Length of Stay Meetings, dates discussed:    Comments:  02/13/15 Lanier ClamKathy Erandy Mceachern RN BSN NCM [phone] No anticipated d/c needs.

## 2015-02-13 NOTE — Progress Notes (Signed)
PROGRESS NOTE    Jesse Scott ZOX:[id number] DOB: [birth year] DOA: 02/11/2015 PCP: No primary care provider on file. Dr. Marlou Porch in [place], [place].  HPI/Brief narrative 74 y.o. male from [place]-non-English-speaking, has been living in the [place] for the last 20 years, resides with his wife in [place], [place], visiting his son in [place] for the last week, PMH of HTN, HLD, GERD, COPD on chronic prednisone, CAD status post remote stent, not on home oxygen, presented to the [facility] ED on 02/11/15 with 1 day history of cough, dyspnea and chills. Patient admitted for presumed bilateral lower lobe community-acquired pneumonia, COPD exacerbation and acute respiratory failure with hypoxia. Improving. Possible discharge in the next 24-48 hours.   Assessment/Plan:  Principal Problem:  CAP (community acquired pneumonia) - Follow up outstanding cultures: sputum: Pending. It appears as though blood cultures were not sent. - Check urine Legionella (pending) and streptococcal antigen (negative) - Check HIV antibody screen (nonreactive) - Flu panel PCR: Negative. DC droplet isolation - Continue empiric IV Rocephin and azithromycin - follow-up chest x-ray in 4-6 weeks to ensure resolution of pneumonia findings - Improving  Active Problems:  COPD exacerbation - Precipitated by acute bronchitis and pneumonia - Treat with oxygen, bronchodilators, IV Solu-Medrol and antibiotics as above - On chronic prednisone at home - Continue IV steroids for additional 24 hours and then transition to by mouth steroid taper and eventually bring down to home prednisone dose.   Acute respiratory failure with hypoxia - Oxygen saturations 88% on room air. - Secondary to pneumonia and COPD exacerbation-management as above - Titrate oxygen down to maintain saturations greater than 90%. - We will need evaluation for home oxygen prior to discharge.   Hypertension - Controlled - Continue home dose of  diltiazem   High cholesterol - Continue statins   GERD (gastroesophageal reflux disease) - Continue PPI   Dehydration - Brief IV fluids - Resolved. Encourage oral intake   Thrombocytopenia - Seems chronic. - Stable  Chest tightness - Atypical for ischemic type of pain - Possibly from pneumonia and COPD exacerbation - Troponin cycle 4 and negative - EKG 02/11/15: Sinus rhythm at 72 bpm, normal axis, LVH, Q waves in leads 3 and aVF. Diffuse elevated J point rather than ST elevation. QTC 429 ms - Continue aspirin and when necessary sublingual NTG - Chest discomfort resolved  History of CAD with stent - Management as above  Hypoglycemia/hyperglycemia - Seen on BMP in ED - Hyperglycemia likely secondary to steroids. Check A1c: 6.2 and place on SSI.  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 Dysuria. - Continue Flomax - Urine microscopy: No UTI features.     Code Status: Full  Family Communication: Discussed with patient's spouse at bedside.  Disposition Plan: Home when medically stable, possibly in 24-48 hours   Consultants:  None  Procedures:  None  Antibiotics:  IV Rocephin 4/24 >  IV azithromycin 4/24 >   Subjective: Continues to feel better. No reported dyspnea or chest pain. Mild throat pain.  Objective: Filed Vitals:   02/12/15 2006 02/12/15 2014 02/13/15 0704 02/13/15 0826  BP: 133/64  141/73   Pulse: 81  61   Temp: 97.6 F (36.4 C)  98 F (36.7 C)   TempSrc: Oral  Oral   Resp: 20  20   Height:      Weight:      SpO2: 100% 98% 93% 91%    Intake/Output Summary (Last 24 hours) at  02/13/15 1331 Last data filed at 02/13/15 0700  Gross per 24 hour  Intake    963 ml  Output      0 ml  Net    963 ml   Filed Weights   02/11/15 1309  Weight: 56.4 kg (124 lb 5.4 oz)     Exam:  General exam: Pleasant elderly male sitting up comfortably in bed Respiratory system: fair breath sounds  bilaterally. Occasional bilateral rhonchi. No increased work of breathing. Cardiovascular system: S1 & S2 heard, RRR. No JVD, murmurs, gallops, clicks or pedal edema. Telemetry: Sinus rhythm with occasional PVC's-DC'd telemetry 4/26  Gastrointestinal system: Abdomen is nondistended, soft and nontender. Normal bowel sounds heard. Central nervous system: Alert and oriented. No focal neurological deficits. Extremities: Symmetric 5 x 5 power.   Data Reviewed: Basic Metabolic Panel:  Recent Labs Lab 02/11/15 0920 02/12/15 0630  NA 133* 138  K 3.5 3.7  CL 103 109  CO2 27 24  GLUCOSE 65* 155*  BUN 12 18  CREATININE 1.21 1.01  CALCIUM 7.9* 7.8*   Liver Function Tests: No results for input(s): AST, ALT, ALKPHOS, BILITOT, PROT, ALBUMIN in the last 168 hours. No results for input(s): LIPASE, AMYLASE in the last 168 hours. No results for input(s): AMMONIA in the last 168 hours. CBC:  Recent Labs Lab 02/11/15 0920 02/12/15 0630  WBC 6.5 8.7  HGB 13.2 12.9*  HCT 40.2 38.9*  MCV 80.2 79.6  PLT 137* 137*   Cardiac Enzymes:  Recent Labs Lab 02/11/15 1900 02/12/15 0042 02/12/15 0630 02/12/15 1220 02/12/15 1835  TROPONINI <0.03 <0.03 <0.03 <0.03 <0.03   BNP (last 3 results) No results for input(s): PROBNP in the last 8760 hours. CBG:  Recent Labs Lab 02/12/15 0743 02/12/15 1638 02/12/15 2212 02/13/15 0749 02/13/15 1139  GLUCAP 161* 160* 247* 135* 234*    Recent Results (from the past 240 hour(s))  Culture, expectorated sputum-assessment     Status: None   Collection Time: 02/11/15 10:48 PM  Result Value Ref Range Status   Specimen Description SPUTUM  Final   Special Requests Normal  Final   Sputum evaluation   Final    THIS SPECIMEN IS ACCEPTABLE. RESPIRATORY CULTURE REPORT TO FOLLOW.   Report Status 02/11/2015 FINAL  Final  Culture, respiratory (NON-Expectorated)     Status: None (Preliminary result)   Collection Time: 02/11/15 10:48 PM  Result Value Ref Range  Status   Specimen Description SPU  Final   Special Requests NONE  Final   Gram Stain   Final    ABUNDANT WBC PRESENT, PREDOMINANTLY PMN RARE SQUAMOUS EPITHELIAL CELLS PRESENT RARE GRAM POSITIVE COCCI IN PAIRS Performed at [facility]    Culture   Final    Culture reincubated for better growth Performed at [facility]    Report Status PENDING  Incomplete          Studies: No results found.      Scheduled Meds: . antiseptic oral rinse  7 mL Mouth Rinse BID  . aspirin EC  81 mg Oral Daily  . azithromycin  500 mg Oral Daily  . cefTRIAXone (ROCEPHIN)  IV  1 g Intravenous Q24H  . celecoxib  200 mg Oral Daily  . diltiazem  120 mg Oral Daily  . enoxaparin (LOVENOX) injection  40 mg Subcutaneous Q24H  . fluticasone  2 spray Each Nare Daily  . gabapentin  300 mg Oral TID  . gabapentin  600 mg Oral QHS  . guaiFENesin  1,200 mg Oral BID  . insulin aspart  0-9 Units Subcutaneous TID WC  . ipratropium-albuterol  3 mL Nebulization TID  . methylPREDNISolone (SOLU-MEDROL) injection  60 mg Intravenous Q12H  . mometasone-formoterol  2 puff Inhalation BID  . montelukast  10 mg Oral QPM  . pantoprazole  40 mg Oral Daily  . rosuvastatin  20 mg Oral Daily  . sodium chloride  3 mL Intravenous Q12H  . tamsulosin  0.4 mg Oral Daily  . tiZANidine  4 mg Oral QPM   Continuous Infusions:   Principal Problem:   CAP (community acquired pneumonia) Active Problems:   COPD exacerbation   Acute respiratory failure with hypoxia   Hypertension   High cholesterol   GERD (gastroesophageal reflux disease)   Dehydration   Thrombocytopenia    Time spent: 25 minutes.    Marcellus ScottHONGALGI,Jeferson Boozer, MD, FACP, FHM. [facility] Pager [phone]  If 7PM-7AM, please contact night-coverage [email or web address] Password [email or web address] 02/13/2015, 1:31 PM    LOS: 2 days

## 2015-02-14 DIAGNOSIS — K219 Gastro-esophageal reflux disease without esophagitis: Secondary | ICD-10-CM

## 2015-02-14 LAB — GLUCOSE, CAPILLARY: GLUCOSE-CAPILLARY: 137 mg/dL — AB (ref 70–99)

## 2015-02-14 MED ORDER — ALBUTEROL SULFATE (2.5 MG/3ML) 0.083% IN NEBU: 2.5000 mg | INHALATION_SOLUTION | Freq: Four times a day (QID) | RESPIRATORY_TRACT | Status: AC | PRN

## 2015-02-14 MED ORDER — MOMETASONE FURO-FORMOTEROL FUM 100-5 MCG/ACT IN AERO: 2.0000 | INHALATION_SPRAY | Freq: Two times a day (BID) | RESPIRATORY_TRACT | Status: AC

## 2015-02-14 MED ORDER — LEVOFLOXACIN 500 MG PO TABS: 500.0000 mg | ORAL_TABLET | Freq: Every day | ORAL | Status: DC

## 2015-02-14 NOTE — Discharge Instructions (Signed)

## 2015-02-14 NOTE — Discharge Summary (Signed)
Physician Discharge Summary  Jesse Scott YQM:[id number] DOB: [birth year] DOA: 02/11/2015  PCP: No primary care provider on file.Dr. Ardine Bjork (in [place], [place])  Admit date: 02/11/2015 Discharge date: 02/14/2015  Recommendations for Outpatient Follow-up:  1. Pt will follow up with PCP in 1 week and he will sch appt since his PCP is in [place]  2. Continue Levaquin for 7 days on discharge for pneumonia.   Discharge Diagnoses:  Principal Problem:   CAP (community acquired pneumonia) Active Problems:   Hypertension   High cholesterol   GERD (gastroesophageal reflux disease)   Acute respiratory failure with hypoxia   COPD exacerbation   Dehydration   Thrombocytopenia    Discharge Condition: stable   Diet recommendation: as tolerated   History of present illness:  74 y.o. male from [place] - lives in [place] but visiting his son in [place] over past week. He has history of hypertension, dyslipidemia, COPD on chronic prednisone, CAD and stent placement in past who presented to [facility] ED worsening shortness of breath, cough, fevers. He was found to have hypoxia on admission and his CXR on admission showed  bilateral lower lobe pneumonia. He was started on azithromycin and rocephin.   Assessment/Plan:  Principal Problem:  CAP (community acquired pneumonia) / Bilateral lobar pneumonia / Acute respiratory failure with hypoxia  - Hypoxia likely due to combination of pneumonia and COPD exacerbation - Pt started on azithromycin and rocephin - Influenza neg, legionella neg, strep pneumonia ne, HIV non reactive - Pt will be discharged with Levaquin for 7 days on discharge - Stable prior to discharge    Active Problems:  COPD exacerbation - Initially had hypoxia but now resp status stable - On chronic prednisone at home, continue on discharge    Hypertension - Continue diltiazem   High cholesterol - Continue statin therapy    GERD (gastroesophageal reflux disease) -  Continue PPI   Dehydration - Resolved with IV fluids    Thrombocytopenia - Seems chronic. - Stable  Chest tightness /History of CAD with stent - Likely from pneumonia. - Resolved - Trop x 3 negative  - The 12 lead EKG showed sinus rhythm  - Continue aspirin   Hypoglycemia/hyperglycemia - Hyperglycemia likely secondary to steroids. 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    Consultants:  None  Procedures:  None  Antibiotics:  IV Rocephin 4/24 > 4/27  IV azithromycin 4/24 > 4/27     Signed:  Manson Passey, MD  [facility] 02/14/2015, 8:31 AM  Pager #: [phone]  Time spent in minutes: less than 30 minutes    Discharge Exam: Filed Vitals:   02/14/15 0522  BP: 156/67  Pulse: 64  Temp: 97.7 F (36.5 C)  Resp: 18   Filed Vitals:   02/13/15 2055 02/13/15 2100 02/14/15 0522 02/14/15 0804  BP:  149/77 156/67   Pulse:  73 64   Temp:  98.1 F (36.7 C) 97.7 F (36.5 C)   TempSrc:  Oral Oral   Resp:  20 18   Height:      Weight:      SpO2: 97% 95% 96% 92%    General: Pt is alert, follows commands appropriately, not in acute distress Cardiovascular: Regular rate and rhythm, S1/S2 +, no murmurs Respiratory: Clear to auscultation bilaterally, no wheezing, no crackles, no rhonchi Abdominal: Soft, non  tender, non distended, bowel sounds +, no guarding Extremities: no edema, no cyanosis, pulses palpable bilaterally DP and PT Neuro: Grossly nonfocal  Discharge Instructions  Discharge Instructions    Call MD for:  difficulty breathing, headache or visual disturbances    Complete by:  As directed      Call MD for:  persistant dizziness or light-headedness    Complete by:  As directed      Call MD for:  persistant nausea and vomiting    Complete by:  As directed      Call  MD for:  severe uncontrolled pain    Complete by:  As directed      Diet - low sodium heart healthy    Complete by:  As directed      Increase activity slowly    Complete by:  As directed             Medication List    TAKE these medications        albuterol (2.5 MG/3ML) 0.083% nebulizer solution  Commonly known as:  PROVENTIL  Take 3 mLs (2.5 mg total) by nebulization every 6 (six) hours as needed for wheezing or shortness of breath.     alendronate 70 MG tablet  Commonly known as:  FOSAMAX  Take 70 mg by mouth every Saturday. Take with a full glass of water on an empty stomach.     aspirin EC 81 MG tablet  Take 81 mg by mouth daily.     celecoxib 200 MG capsule  Commonly known as:  CELEBREX  Take 200 mg by mouth daily.     diltiazem 120 MG 24 hr capsule  Commonly known as:  DILACOR XR  Take 120 mg by mouth daily.     DM-Doxylamine-Acetaminophen 15-6.25-325 MG/15ML Liqd  Take 1 capsule by mouth at bedtime as needed (for sleep and cold).     esomeprazole 20 MG capsule  Commonly known as:  NEXIUM  Take 20 mg by mouth daily at 12 noon.     fluticasone 50 MCG/ACT nasal spray  Commonly known as:  FLONASE  Place 2 sprays into both nostrils daily.     gabapentin 300 MG capsule  Commonly known as:  NEURONTIN  Take 300-600 mg by mouth 4 (four) times daily. Takes  at 7am 12pm and 7pm and  before bedtime     levofloxacin 500 MG tablet  Commonly known as:  LEVAQUIN  Take 1 tablet (500 mg total) by mouth daily.     mometasone-formoterol 100-5 MCG/ACT Aero  Commonly known as:  DULERA  Inhale 2 puffs into the lungs 2 (two) times daily.     nitroGLYCERIN 0.4 MG SL tablet  Commonly known as:  NITROSTAT  Place 0.4 mg under the tongue every 5 (five) minutes as needed for chest pain.     prednisoLONE 5 MG Tabs tablet  Take 5 mg by mouth daily.     rosuvastatin 20 MG tablet  Commonly known as:  CRESTOR  Take 20 mg by mouth daily.     SINGULAIR 10 MG tablet   Generic drug:  montelukast  Take 10 mg by mouth every evening.     tamsulosin 0.4 MG Caps capsule  Commonly known as:  FLOMAX  Take 0.4 mg by mouth daily.     tiZANidine 4 MG tablet  Commonly known as:  ZANAFLEX  Take 4 mg by mouth every evening.           The results  of significant diagnostics from this hospitalization (including imaging, microbiology, ancillary and laboratory) are listed below for reference.    Significant Diagnostic Studies: Dg Chest 2 View  02/11/2015   CLINICAL DATA:  Productive cough and fever for the past 2 days.  EXAM: CHEST  2 VIEW  COMPARISON:  None.  FINDINGS: Normal sized heart. Mild patchy opacity in both lower lobes. Mild diffuse peribronchial thickening and accentuation of the interstitial markings. Diffuse osteopenia.  IMPRESSION: 1. Mild bilateral lower lobe pneumonia. 2. Mild bronchitic changes.   Electronically Signed   By: Beckie SaltsSteven  Reid M.D.   On: 02/11/2015 10:31    Microbiology: Recent Results (from the past 240 hour(s))  Culture, expectorated sputum-assessment     Status: None   Collection Time: 02/11/15 10:48 PM  Result Value Ref Range Status   Specimen Description SPUTUM  Final   Special Requests Normal  Final   Sputum evaluation   Final    THIS SPECIMEN IS ACCEPTABLE. RESPIRATORY CULTURE REPORT TO FOLLOW.   Report Status 02/11/2015 FINAL  Final  Culture, respiratory (NON-Expectorated)     Status: None (Preliminary result)   Collection Time: 02/11/15 10:48 PM  Result Value Ref Range Status   Specimen Description SPU  Final   Special Requests NONE  Final   Gram Stain   Final    ABUNDANT WBC PRESENT, PREDOMINANTLY PMN RARE SQUAMOUS EPITHELIAL CELLS PRESENT RARE GRAM POSITIVE COCCI IN PAIRS Performed at [facility]    Culture   Final    Culture reincubated for better growth Performed at [facility]    Report Status PENDING  Incomplete     Labs: Basic Metabolic Panel:  Recent Labs Lab 02/11/15 0920  02/12/15 0630  NA 133* 138  K 3.5 3.7  CL 103 109  CO2 27 24  GLUCOSE 65* 155*  BUN 12 18  CREATININE 1.21 1.01  CALCIUM 7.9* 7.8*   Liver Function Tests: No results for input(s): AST, ALT, ALKPHOS, BILITOT, PROT, ALBUMIN in the last 168 hours. No results for input(s): LIPASE, AMYLASE in the last 168 hours. No results for input(s): AMMONIA in the last 168 hours. CBC:  Recent Labs Lab 02/11/15 0920 02/12/15 0630  WBC 6.5 8.7  HGB 13.2 12.9*  HCT 40.2 38.9*  MCV 80.2 79.6  PLT 137* 137*   Cardiac Enzymes:  Recent Labs Lab 02/11/15 1900 02/12/15 0042 02/12/15 0630 02/12/15 1220 02/12/15 1835  TROPONINI <0.03 <0.03 <0.03 <0.03 <0.03   BNP: BNP (last 3 results) No results for input(s): BNP in the last 8760 hours.  ProBNP (last 3 results) No results for input(s): PROBNP in the last 8760 hours.  CBG:  Recent Labs Lab 02/13/15 0749 02/13/15 1139 02/13/15 1619 02/13/15 2253 02/14/15 0739  GLUCAP 135* 234* 180* 200* 137*

## 2015-02-16 LAB — CULTURE, RESPIRATORY W GRAM STAIN

## 2015-02-16 LAB — CULTURE, RESPIRATORY

## 2015-06-16 DIAGNOSIS — J441 Chronic obstructive pulmonary disease with (acute) exacerbation: Secondary | ICD-10-CM | POA: Diagnosis not present

## 2015-06-16 DIAGNOSIS — K219 Gastro-esophageal reflux disease without esophagitis: Secondary | ICD-10-CM | POA: Diagnosis not present

## 2015-06-16 DIAGNOSIS — I10 Essential (primary) hypertension: Secondary | ICD-10-CM | POA: Insufficient documentation

## 2015-06-16 DIAGNOSIS — Z9861 Coronary angioplasty status: Secondary | ICD-10-CM | POA: Diagnosis not present

## 2015-06-16 DIAGNOSIS — Z7951 Long term (current) use of inhaled steroids: Secondary | ICD-10-CM | POA: Insufficient documentation

## 2015-06-16 DIAGNOSIS — I2510 Atherosclerotic heart disease of native coronary artery without angina pectoris: Secondary | ICD-10-CM | POA: Insufficient documentation

## 2015-06-16 DIAGNOSIS — R079 Chest pain, unspecified: Secondary | ICD-10-CM | POA: Diagnosis present

## 2015-06-16 DIAGNOSIS — Z792 Long term (current) use of antibiotics: Secondary | ICD-10-CM | POA: Insufficient documentation

## 2015-06-16 DIAGNOSIS — M1990 Unspecified osteoarthritis, unspecified site: Secondary | ICD-10-CM | POA: Insufficient documentation

## 2015-06-16 DIAGNOSIS — Z7982 Long term (current) use of aspirin: Secondary | ICD-10-CM | POA: Diagnosis not present

## 2015-06-16 DIAGNOSIS — Z7952 Long term (current) use of systemic steroids: Secondary | ICD-10-CM | POA: Insufficient documentation

## 2015-06-16 DIAGNOSIS — E780 Pure hypercholesterolemia: Secondary | ICD-10-CM | POA: Insufficient documentation

## 2015-06-16 DIAGNOSIS — Z79899 Other long term (current) drug therapy: Secondary | ICD-10-CM | POA: Diagnosis not present

## 2015-06-16 DIAGNOSIS — R509 Fever, unspecified: Secondary | ICD-10-CM | POA: Insufficient documentation

## 2015-06-17 ENCOUNTER — Emergency Department (HOSPITAL_COMMUNITY)
Admission: EM | Admit: 2015-06-17 | Discharge: 2015-06-17 | Disposition: A | Discharge status: Home / Self Care | Payer: Medicare Other | Attending: Emergency Medicine | Admitting: Emergency Medicine

## 2015-06-17 ENCOUNTER — Emergency Department (HOSPITAL_COMMUNITY): Payer: Medicare Other

## 2015-06-17 ENCOUNTER — Encounter (HOSPITAL_COMMUNITY): Payer: Self-pay | Admitting: Emergency Medicine

## 2015-06-17 DIAGNOSIS — R0600 Dyspnea, unspecified: Secondary | ICD-10-CM

## 2015-06-17 DIAGNOSIS — J441 Chronic obstructive pulmonary disease with (acute) exacerbation: Secondary | ICD-10-CM | POA: Diagnosis not present

## 2015-06-17 LAB — CBC WITH DIFFERENTIAL/PLATELET
BASOS ABS: 0 10*3/uL (ref 0.0–0.1)
BASOS PCT: 0 % (ref 0–1)
Eosinophils Absolute: 0.3 10*3/uL (ref 0.0–0.7)
Eosinophils Relative: 4 % (ref 0–5)
HEMATOCRIT: 37.8 % — AB (ref 39.0–52.0)
HEMOGLOBIN: 12.7 g/dL — AB (ref 13.0–17.0)
Lymphocytes Relative: 13 % (ref 12–46)
Lymphs Abs: 1 10*3/uL (ref 0.7–4.0)
MCH: 26.3 pg (ref 26.0–34.0)
MCHC: 33.6 g/dL (ref 30.0–36.0)
MCV: 78.4 fL (ref 78.0–100.0)
Monocytes Absolute: 0.6 10*3/uL (ref 0.1–1.0)
Monocytes Relative: 8 % (ref 3–12)
NEUTROS ABS: 5.9 10*3/uL (ref 1.7–7.7)
NEUTROS PCT: 75 % (ref 43–77)
Platelets: 139 10*3/uL — ABNORMAL LOW (ref 150–400)
RBC: 4.82 MIL/uL (ref 4.22–5.81)
RDW: 14.9 % (ref 11.5–15.5)
WBC: 7.9 10*3/uL (ref 4.0–10.5)

## 2015-06-17 LAB — BASIC METABOLIC PANEL
ANION GAP: 7 (ref 5–15)
BUN: 13 mg/dL (ref 6–20)
CO2: 26 mmol/L (ref 22–32)
Calcium: 8.3 mg/dL — ABNORMAL LOW (ref 8.9–10.3)
Chloride: 103 mmol/L (ref 101–111)
Creatinine, Ser: 1.31 mg/dL — ABNORMAL HIGH (ref 0.61–1.24)
GFR, EST NON AFRICAN AMERICAN: 52 mL/min — AB (ref >60)
GLUCOSE: 128 mg/dL — AB (ref 65–99)
POTASSIUM: 4.2 mmol/L (ref 3.5–5.1)
SODIUM: 136 mmol/L (ref 135–145)

## 2015-06-17 LAB — BRAIN NATRIURETIC PEPTIDE: B NATRIURETIC PEPTIDE 5: 88.9 pg/mL (ref 0.0–100.0)

## 2015-06-17 LAB — TROPONIN I

## 2015-06-17 MED ORDER — ASPIRIN 81 MG PO CHEW
CHEWABLE_TABLET | ORAL | Status: AC
  Administered 2015-06-17: 324 mg
  Filled 2015-06-17: qty 4

## 2015-06-17 MED ORDER — ASPIRIN EC 325 MG PO TBEC
325.0000 mg | DELAYED_RELEASE_TABLET | Freq: Every day | ORAL | Status: DC
  Filled 2015-06-17: qty 1

## 2015-06-17 NOTE — ED Provider Notes (Signed)
CSN: [id number]     Arrival date & time 06/16/15  2354 History  This chart was scribed for Marisa Severin, MD by Doreatha Martin, ED Scribe. This patient was seen in room WTR1/WLPT1 and the patient's care was started at 12:33 AM.     Chief Complaint  Patient presents with  . Chest Pain   The history is provided by the patient and a relative. No language interpreter was used.    HPI Comments: Jesse Scott is a 74 y.o. male with Hx of HTN, HLD, GERD, COPD, CAD, stent placement (4 years ago) who presents to the Emergency Department complaining of moderate, dull, left-sided CP onset 30 minutes ago with associated throat tightness and throat dryness onset 2 days ago, cough for one day, subjective fever this morning (now resolved). He states throat tightness is worsened in the supine position. Son states that he recently got a CPAP 3 weeks ago and uses it nightly. Pt took Tylenol PTA with mild relief. No Hx of similar symptoms. Pt takes NTG at home. The son notes a Hx of CP and states he has not been seen by Cardiology recently. Per son, he is visiting from [place] and will most likely stay for a month. No recent changes in medications.   Past Medical History  Diagnosis Date  . Arthritis   . Hypertension   . High cholesterol   . GERD (gastroesophageal reflux disease)   . COPD (chronic obstructive pulmonary disease)   . CAD (coronary artery disease)     stent ? 2007   Past Surgical History  Procedure Laterality Date  . Hernia repair    . Eye surgery      False right eye  . Coronary angioplasty with stent placement     Family History  Problem Relation Age of Onset  . Family history unknown: Yes   Social History  Substance Use Topics  . Smoking status: Never Smoker   . Smokeless tobacco: None  . Alcohol Use: No    Review of Systems  Constitutional: Positive for fever ( subjective, resolved).  HENT:       Throat tightness, dry throat   Respiratory: Positive for cough.   All other systems  reviewed and are negative.  Allergies  Review of patient's allergies indicates no known allergies.  Home Medications   Prior to Admission medications   Medication Sig Start Date End Date Taking? Authorizing Provider  albuterol (PROVENTIL) (2.5 MG/3ML) 0.083% nebulizer solution Take 3 mLs (2.5 mg total) by nebulization every 6 (six) hours as needed for wheezing or shortness of breath. 02/14/15   Alison Murray, MD  alendronate (FOSAMAX) 70 MG tablet Take 70 mg by mouth every Saturday. Take with a full glass of water on an empty stomach.    Historical Provider, MD  aspirin EC 81 MG tablet Take 81 mg by mouth daily.    Historical Provider, MD  celecoxib (CELEBREX) 200 MG capsule Take 200 mg by mouth daily.    Historical Provider, MD  diltiazem (DILACOR XR) 120 MG 24 hr capsule Take 120 mg by mouth daily.    Historical Provider, MD  DM-Doxylamine-Acetaminophen 15-6.25-325 MG/15ML LIQD Take 1 capsule by mouth at bedtime as needed (for sleep and cold).    Historical Provider, MD  esomeprazole (NEXIUM) 20 MG capsule Take 20 mg by mouth daily at 12 noon.    Historical Provider, MD  fluticasone (FLONASE) 50 MCG/ACT nasal spray Place 2 sprays into both nostrils daily.  11/01/13  Historical Provider, MD  gabapentin (NEURONTIN) 300 MG capsule Take 300-600 mg by mouth 4 (four) times daily. Takes  at 7am 12pm and 7pm and  before bedtime 12/26/13   Historical Provider, MD  levofloxacin (LEVAQUIN) 500 MG tablet Take 1 tablet (500 mg total) by mouth daily. 02/14/15   Alison Murray, MD  mometasone-formoterol (DULERA) 100-5 MCG/ACT AERO Inhale 2 puffs into the lungs 2 (two) times daily. 02/14/15   Alison Murray, MD  nitroGLYCERIN (NITROSTAT) 0.4 MG SL tablet Place 0.4 mg under the tongue every 5 (five) minutes as needed for chest pain.    Historical Provider, MD  prednisoLONE 5 MG TABS tablet Take 5 mg by mouth daily.    Historical Provider, MD  rosuvastatin (CRESTOR) 20 MG tablet Take 20 mg by mouth daily.     Historical Provider, MD  SINGULAIR 10 MG tablet Take 10 mg by mouth every evening.  11/17/13   Historical Provider, MD  tamsulosin (FLOMAX) 0.4 MG CAPS capsule Take 0.4 mg by mouth daily.  01/17/14   Historical Provider, MD  tiZANidine (ZANAFLEX) 4 MG tablet Take 4 mg by mouth every evening.  11/04/13   Historical Provider, MD   BP 126/72 mmHg  Pulse 64  Temp(Src) 97.7 F (36.5 C) (Oral)  Resp 20  SpO2 93% Physical Exam  Constitutional: He is oriented to person, place, and time. He appears well-developed and well-nourished.  HENT:  Head: Normocephalic and atraumatic.  Nose: Nose normal.  Mouth/Throat: Oropharynx is clear and moist.  Eyes: Conjunctivae and EOM are normal. Pupils are equal, round, and reactive to light.  Neck: Normal range of motion. Neck supple. No JVD present. No tracheal deviation present. No thyromegaly present.  Cardiovascular: Normal rate, regular rhythm, normal heart sounds and intact distal pulses.  Exam reveals no gallop and no friction rub.   No murmur heard. Pulmonary/Chest: Effort normal and breath sounds normal. No stridor. No respiratory distress. He has no wheezes. He has no rales. He exhibits no tenderness.  Abdominal: Soft. Bowel sounds are normal. He exhibits no distension and no mass. There is no tenderness. There is no rebound and no guarding.  Musculoskeletal: Normal range of motion. He exhibits no edema or tenderness.  Lymphadenopathy:    He has no cervical adenopathy.  Neurological: He is alert and oriented to person, place, and time. He displays normal reflexes. He exhibits normal muscle tone. Coordination normal.  Skin: Skin is warm and dry. No rash noted. No erythema. No pallor.  Psychiatric: He has a normal mood and affect. His behavior is normal. Judgment and thought content normal.  Nursing note and vitals reviewed.  ED Course  Procedures (including critical care time) DIAGNOSTIC STUDIES: Oxygen Saturation is 93% on RA, adequate by my  interpretation.    COORDINATION OF CARE: 12:40 AM Discussed treatment plan with pt at bedside and pt agreed to plan.   Labs Review Labs Reviewed  BASIC METABOLIC PANEL - Abnormal; Notable for the following:    Glucose, Bld 128 (*)    Creatinine, Ser 1.31 (*)    Calcium 8.3 (*)    GFR calc non Af Amer 52 (*)    All other components within normal limits  CBC WITH DIFFERENTIAL/PLATELET - Abnormal; Notable for the following:    Hemoglobin 12.7 (*)    HCT 37.8 (*)    Platelets 139 (*)    All other components within normal limits  TROPONIN I  BRAIN NATRIURETIC PEPTIDE  TROPONIN I    Imaging  Review Dg Chest 2 View  06/17/2015   CLINICAL DATA:  Left-sided chest pain, shortness of breath, and productive cough for 1 day.  EXAM: CHEST  2 VIEW  COMPARISON:  02/11/2015  FINDINGS: Shallow inspiration with atelectasis in the lung bases. Normal heart size. Pulmonary vascularity appears prominent possibly representing congestion. No edema or consolidation. Central interstitial changes and bronchial wall thickening suggesting chronic bronchitis. No blunting of costophrenic angles. No pneumothorax. Degenerative changes in the spine.  IMPRESSION: Shallow inspiration with atelectasis in the lung bases. Chronic bronchitic changes. Mild pulmonary vascular congestion.   Electronically Signed   By: Burman Nieves M.D.   On: 06/17/2015 01:44   I have personally reviewed and evaluated these images and lab results as part of my medical decision-making.   EKG Interpretation   Date/Time:  Sunday June 17 2015 00:43:55 EDT Ventricular Rate:  63 PR Interval:  184 QRS Duration: 102 QT Interval:  425 QTC Calculation: 435 R Axis:   29 Text Interpretation:  Sinus rhythm Probable left atrial enlargement  Abnormal R-wave progression, early transition Left ventricular hypertrophy  Inferior infarct, old Baseline wander in lead(s) II III aVR aVF No  significant change since last tracing Confirmed by Velma Hanna  MD,  Anntionette Madkins ([id number])  on 06/17/2015 2:02:05 AM      MDM   Final diagnoses:  Dyspnea    74 year old male with choking sensation when laying flat tonight using his Cpap.  Patient developed chest pain.  In route.  There is a language barrier as speaks [place], patient does not.  EKG unchanged from prior.  Initial troponin negative.  Plan for repeat troponin.  Patient reports that he feels fine while being here.  We'll have him contact his doctor who prescribed Cpap machine to him to go over settings.  I personally performed the services described in this documentation, which was scribed in my presence. The recorded information has been reviewed and is accurate.    Marisa Severin, MD 06/17/15 [phone]

## 2015-06-17 NOTE — ED Notes (Signed)
Pt arrived to the ED with a complaint of chest pain and shortness of breath.  Pt states when he lies down that he feels as if his throat is closing and he has shortness of breath.  Pt states he has sleep apnea and that he has a machine but doesn't feel it is working properly

## 2015-06-17 NOTE — Discharge Instructions (Signed)

## 2015-11-29 ENCOUNTER — Emergency Department (HOSPITAL_COMMUNITY)
Admission: EM | Admit: 2015-11-29 | Discharge: 2015-11-30 | Disposition: A | Discharge status: Home / Self Care | Payer: Medicare Other | Attending: Emergency Medicine | Admitting: Emergency Medicine

## 2015-11-29 ENCOUNTER — Encounter (HOSPITAL_COMMUNITY): Payer: Self-pay | Admitting: *Deleted

## 2015-11-29 ENCOUNTER — Emergency Department (HOSPITAL_COMMUNITY): Payer: Medicare Other

## 2015-11-29 DIAGNOSIS — J449 Chronic obstructive pulmonary disease, unspecified: Secondary | ICD-10-CM | POA: Diagnosis not present

## 2015-11-29 DIAGNOSIS — I2510 Atherosclerotic heart disease of native coronary artery without angina pectoris: Secondary | ICD-10-CM | POA: Diagnosis not present

## 2015-11-29 DIAGNOSIS — Z79899 Other long term (current) drug therapy: Secondary | ICD-10-CM | POA: Insufficient documentation

## 2015-11-29 DIAGNOSIS — Y9389 Activity, other specified: Secondary | ICD-10-CM | POA: Insufficient documentation

## 2015-11-29 DIAGNOSIS — Y998 Other external cause status: Secondary | ICD-10-CM | POA: Insufficient documentation

## 2015-11-29 DIAGNOSIS — Z9861 Coronary angioplasty status: Secondary | ICD-10-CM | POA: Insufficient documentation

## 2015-11-29 DIAGNOSIS — Z791 Long term (current) use of non-steroidal anti-inflammatories (NSAID): Secondary | ICD-10-CM | POA: Diagnosis not present

## 2015-11-29 DIAGNOSIS — E7800 Pure hypercholesterolemia, unspecified: Secondary | ICD-10-CM | POA: Insufficient documentation

## 2015-11-29 DIAGNOSIS — Z7982 Long term (current) use of aspirin: Secondary | ICD-10-CM | POA: Insufficient documentation

## 2015-11-29 DIAGNOSIS — I10 Essential (primary) hypertension: Secondary | ICD-10-CM | POA: Insufficient documentation

## 2015-11-29 DIAGNOSIS — K219 Gastro-esophageal reflux disease without esophagitis: Secondary | ICD-10-CM | POA: Insufficient documentation

## 2015-11-29 DIAGNOSIS — Z7951 Long term (current) use of inhaled steroids: Secondary | ICD-10-CM | POA: Diagnosis not present

## 2015-11-29 DIAGNOSIS — S99922A Unspecified injury of left foot, initial encounter: Secondary | ICD-10-CM | POA: Diagnosis present

## 2015-11-29 DIAGNOSIS — W208XXA Other cause of strike by thrown, projected or falling object, initial encounter: Secondary | ICD-10-CM | POA: Diagnosis not present

## 2015-11-29 DIAGNOSIS — Z7952 Long term (current) use of systemic steroids: Secondary | ICD-10-CM | POA: Diagnosis not present

## 2015-11-29 DIAGNOSIS — Y9289 Other specified places as the place of occurrence of the external cause: Secondary | ICD-10-CM | POA: Diagnosis not present

## 2015-11-29 DIAGNOSIS — S9032XA Contusion of left foot, initial encounter: Secondary | ICD-10-CM | POA: Diagnosis not present

## 2015-11-29 DIAGNOSIS — M1990 Unspecified osteoarthritis, unspecified site: Secondary | ICD-10-CM | POA: Insufficient documentation

## 2015-11-29 MED ORDER — ACETAMINOPHEN 325 MG PO TABS
650.0000 mg | ORAL_TABLET | Freq: Once | ORAL | Status: AC
  Administered 2015-11-30: 650 mg via ORAL
  Filled 2015-11-29: qty 2

## 2015-11-29 NOTE — ED Provider Notes (Signed)
CSN: [id number]     Arrival date & time 11/29/15  2101 History   First MD Initiated Contact with Patient 11/29/15 2318     Chief Complaint  Patient presents with  . Foot Pain     HPI   Jesse Scott is an 75 y.o. male with history of arthritis, CAD, HTN, COPD who presents to the ED for evaluation of left foot pain after injury. He speaks only [place] his son acts as Nurse, learning disability. Pt reports a bottle of lotion fell from a high shelf and hit the dorsum of his left foot on 2/71/7. Pt states he has still been able to walk and bear weight, though his foot is painful. He states initially his foot was swollen but the swelling has resolved. He states there continues to be a large bruise on the dorsum of his foot. He has tried ginger, ice, warm soaks with no relief of the pain. States the pain is really only when he walks or touches it. Denies numbness, weakness, tingling. Denies other fall, hitting his head, LOC. He declines tylenol in the ED today.  Past Medical History  Diagnosis Date  . Arthritis   . Hypertension   . High cholesterol   . GERD (gastroesophageal reflux disease)   . COPD (chronic obstructive pulmonary disease) (HCC)   . CAD (coronary artery disease)     stent ? 2007   Past Surgical History  Procedure Laterality Date  . Hernia repair    . Eye surgery      False right eye  . Coronary angioplasty with stent placement     Family History  Problem Relation Age of Onset  . Family history unknown: Yes   Social History  Substance Use Topics  . Smoking status: Never Smoker   . Smokeless tobacco: None  . Alcohol Use: No    Review of Systems  All other systems reviewed and are negative.     Allergies  Review of patient's allergies indicates no known allergies.  Home Medications   Prior to Admission medications   Medication Sig Start Date End Date Taking? Authorizing Provider  acetaminophen (TYLENOL) 500 MG tablet Take 1,000 mg by mouth every 6 (six) hours as  needed for moderate pain.    Historical Provider, MD  albuterol (PROVENTIL) (2.5 MG/3ML) 0.083% nebulizer solution Take 3 mLs (2.5 mg total) by nebulization every 6 (six) hours as needed for wheezing or shortness of breath. 02/14/15   Alison Murray, MD  alendronate (FOSAMAX) 70 MG tablet Take 70 mg by mouth every Saturday. Take with a full glass of water on an empty stomach.    Historical Provider, MD  Artificial Saliva Basilio [place]) AERS Use as directed 1 application in the mouth or throat every 4 (four) hours as needed (dry mouth).    Historical Provider, MD  aspirin EC 81 MG tablet Take 81 mg by mouth daily.    Historical Provider, MD  celecoxib (CELEBREX) 200 MG capsule Take 200 mg by mouth daily.    Historical Provider, MD  Cholecalciferol (VITAMIN D) 2000 UNITS CAPS Take 2,000 Units by mouth daily.    Historical Provider, MD  diltiazem (DILACOR XR) 120 MG 24 hr capsule Take 120 mg by mouth daily.    Historical Provider, MD  esomeprazole (NEXIUM) 20 MG capsule Take 20 mg by mouth daily at 12 noon.    Historical Provider, MD  fluticasone (FLONASE) 50 MCG/ACT nasal spray Place 2 sprays into both nostrils 2 (two) times daily.  11/01/13   Historical Provider, MD  gabapentin (NEURONTIN) 300 MG capsule Take 300-600 mg by mouth 4 (four) times daily. Takes  at 7am 12pm and 7pm and  before bedtime 12/26/13   Historical Provider, MD  levofloxacin (LEVAQUIN) 500 MG tablet Take 1 tablet (500 mg total) by mouth daily. Patient not taking: Reported on 06/17/2015 02/14/15   Alison Murray, MD  mometasone-formoterol [facility]) 100-5 MCG/ACT AERO Inhale 2 puffs into the lungs 2 (two) times daily. 02/14/15   Alison Murray, MD  neomycin-bacitracin-polymyxin (NEOSPORIN) [phone] ointment Apply 1 application topically 4 (four) times daily as needed (wound care).    Historical Provider, MD  nitroGLYCERIN (NITROSTAT) 0.4 MG SL tablet Place 0.4 mg under the tongue every 5 (five) minutes as needed for chest pain.     Historical Provider, MD  Polyethyl Glycol-Propyl Glycol (SYSTANE ULTRA) 0.4-0.3 % SOLN Apply 1 application to eye 3 (three) times daily as needed (dry eyes).    Historical Provider, MD  prednisoLONE 5 MG TABS tablet Take 5 mg by mouth daily.    Historical Provider, MD  rosuvastatin (CRESTOR) 20 MG tablet Take 20 mg by mouth daily.    Historical Provider, MD  SINGULAIR 10 MG tablet Take 10 mg by mouth every evening.  11/17/13   Historical Provider, MD  tamsulosin (FLOMAX) 0.4 MG CAPS capsule Take 0.4 mg by mouth daily.  01/17/14   Historical Provider, MD  tiZANidine (ZANAFLEX) 4 MG tablet Take 4 mg by mouth every evening.  11/04/13   Historical Provider, MD   BP 163/77 mmHg  Pulse 77  Temp(Src) 98 F (36.7 C) (Oral)  Resp 18  SpO2 95% Physical Exam  Constitutional: He is oriented to person, place, and time. No distress.  HENT:  Head: Atraumatic.  Right Ear: External ear normal.  Left Ear: External ear normal.  Nose: Nose normal.  Mouth/Throat: Oropharynx is clear and moist. No oropharyngeal exudate.  Eyes: Conjunctivae and EOM are normal. Pupils are equal, round, and reactive to light. No scleral icterus.  Neck: Normal range of motion. Neck supple.  Cardiovascular: Normal rate, regular rhythm, normal heart sounds and intact distal pulses.   Pulmonary/Chest: Effort normal and breath sounds normal. No respiratory distress. He has no wheezes. He exhibits no tenderness.  Abdominal: Soft. Bowel sounds are normal. He exhibits no distension.  Musculoskeletal: He exhibits no edema.       Feet:  Left dorsum of foot with large area of mild bruising. Diffusely TTP. 2+ distal pulses, good cap refill. FROM of toes and ankle. Steady gait.   Neurological: He is alert and oriented to person, place, and time. No cranial nerve deficit.  Skin: Skin is warm and dry. He is not diaphoretic.  Psychiatric: He has a normal mood and affect. His behavior is normal.  Nursing note and vitals reviewed.   ED  Course  Procedures (including critical care time) Labs Review Labs Reviewed - No data to display  Imaging Review Dg Foot Complete Left  11/29/2015  CLINICAL DATA:  Left foot injury today with left foot pain. EXAM: LEFT FOOT - COMPLETE 3+ VIEW COMPARISON:  None. FINDINGS: There is no evidence of fracture or dislocation. There is minimal plantar calcaneal spur. Soft tissues are unremarkable. IMPRESSION: There is no acute fracture or dislocation. Electronically Signed   By: Sherian Rein M.D.   On: 11/29/2015 21:42   I have personally reviewed and evaluated these images and lab results as part of my medical decision-making.   EKG  Interpretation None      MDM   Final diagnoses:  Contusion of left foot, initial encounter    Pt seen and evaluated with attending MD Palumbo. XR is negative for acute pathology. Pt has area of contusion but no other focal findings. Neurovascularly intact. Will give post-op shoe as pt currently ambulating in sandals. Will give reusable ice pack for home. Rx given for Tylenol as well as pt changed his mind and does want Tylenol in the ED. Discussed with pt and his family he can anticipate bruising to remain for 10-14 days but should gradually improve. Encouraged RICE therapy. Instructed to f/u with PCP within one week. ER return precautions given.     Carlene Coria, Cordelia Poche 11/30/15 0012  April Palumbo, MD 11/30/15 [phone]

## 2015-11-29 NOTE — ED Notes (Signed)
A lotion bottle fell on the pt left foot, pt with pain and bruising to left foot.

## 2015-11-30 DIAGNOSIS — S9032XA Contusion of left foot, initial encounter: Secondary | ICD-10-CM | POA: Diagnosis not present

## 2015-11-30 MED ORDER — ACETAMINOPHEN 325 MG PO TABS: 650.0000 mg | ORAL_TABLET | Freq: Four times a day (QID) | ORAL | Status: AC | PRN

## 2015-11-30 NOTE — Discharge Instructions (Signed)
Jesse Scott x-ray was normal with no evidence of fracture. He can expect to continue to have bruising for the next week or two though it should improve gradually. He may use ice to the affected area for 20 minutes every 2 hours. We gave him a hard-soled shoe in the ER today to help with stability and compression. He may walk as tolerated. At home, encourage him to rest and elevate his foot when possible. I gave him a prescription for Tylenol which he may take as needed for pain. Please follow up with his primary care provider within one week.

## 2018-01-09 ENCOUNTER — Emergency Department (HOSPITAL_COMMUNITY): Payer: Medicare Other

## 2018-01-09 ENCOUNTER — Encounter (HOSPITAL_COMMUNITY): Payer: Self-pay | Admitting: Emergency Medicine

## 2018-01-09 ENCOUNTER — Other Ambulatory Visit: Payer: Self-pay

## 2018-01-09 ENCOUNTER — Emergency Department (HOSPITAL_COMMUNITY)
Admission: EM | Admit: 2018-01-09 | Discharge: 2018-01-09 | Disposition: A | Discharge status: Home / Self Care | Payer: Medicare Other | Attending: Emergency Medicine | Admitting: Emergency Medicine

## 2018-01-09 DIAGNOSIS — J441 Chronic obstructive pulmonary disease with (acute) exacerbation: Secondary | ICD-10-CM | POA: Diagnosis not present

## 2018-01-09 DIAGNOSIS — R05 Cough: Secondary | ICD-10-CM | POA: Diagnosis present

## 2018-01-09 DIAGNOSIS — I10 Essential (primary) hypertension: Secondary | ICD-10-CM | POA: Diagnosis not present

## 2018-01-09 DIAGNOSIS — Z79899 Other long term (current) drug therapy: Secondary | ICD-10-CM | POA: Insufficient documentation

## 2018-01-09 DIAGNOSIS — B9789 Other viral agents as the cause of diseases classified elsewhere: Secondary | ICD-10-CM | POA: Diagnosis not present

## 2018-01-09 DIAGNOSIS — I2510 Atherosclerotic heart disease of native coronary artery without angina pectoris: Secondary | ICD-10-CM | POA: Diagnosis not present

## 2018-01-09 DIAGNOSIS — J069 Acute upper respiratory infection, unspecified: Secondary | ICD-10-CM | POA: Insufficient documentation

## 2018-01-09 DIAGNOSIS — Z7982 Long term (current) use of aspirin: Secondary | ICD-10-CM | POA: Diagnosis not present

## 2018-01-09 LAB — I-STAT TROPONIN, ED
TROPONIN I, POC: 0 ng/mL (ref 0.00–0.08)
Troponin i, poc: 0 ng/mL (ref 0.00–0.08)

## 2018-01-09 LAB — CBC
HEMATOCRIT: 38.9 % — AB (ref 39.0–52.0)
HEMOGLOBIN: 13 g/dL (ref 13.0–17.0)
MCH: 26.3 pg (ref 26.0–34.0)
MCHC: 33.4 g/dL (ref 30.0–36.0)
MCV: 78.6 fL (ref 78.0–100.0)
Platelets: 184 10*3/uL (ref 150–400)
RBC: 4.95 MIL/uL (ref 4.22–5.81)
RDW: 16.6 % — ABNORMAL HIGH (ref 11.5–15.5)
WBC: 6.2 10*3/uL (ref 4.0–10.5)

## 2018-01-09 LAB — BASIC METABOLIC PANEL
ANION GAP: 9 (ref 5–15)
BUN: 9 mg/dL (ref 6–20)
CO2: 27 mmol/L (ref 22–32)
Calcium: 8.7 mg/dL — ABNORMAL LOW (ref 8.9–10.3)
Chloride: 101 mmol/L (ref 101–111)
Creatinine, Ser: 1.01 mg/dL (ref 0.61–1.24)
GFR calc non Af Amer: >60 mL/min (ref >60)
GLUCOSE: 86 mg/dL (ref 65–99)
Potassium: 3.8 mmol/L (ref 3.5–5.1)
Sodium: 137 mmol/L (ref 135–145)

## 2018-01-09 MED ORDER — DOXYCYCLINE HYCLATE 100 MG PO TABS
100.0000 mg | ORAL_TABLET | Freq: Once | ORAL | Status: AC
  Administered 2018-01-09: 100 mg via ORAL
  Filled 2018-01-09: qty 1

## 2018-01-09 MED ORDER — IPRATROPIUM-ALBUTEROL 0.5-2.5 (3) MG/3ML IN SOLN
3.0000 mL | Freq: Once | RESPIRATORY_TRACT | Status: AC
  Administered 2018-01-09: 3 mL via RESPIRATORY_TRACT
  Filled 2018-01-09: qty 3

## 2018-01-09 MED ORDER — DOXYCYCLINE HYCLATE 100 MG PO CAPS: 100.0000 mg | ORAL_CAPSULE | Freq: Two times a day (BID) | ORAL | 0 refills | Status: AC

## 2018-01-09 MED ORDER — ALBUTEROL SULFATE HFA 108 (90 BASE) MCG/ACT IN AERS: 2.0000 | INHALATION_SPRAY | RESPIRATORY_TRACT | 1 refills | Status: AC | PRN

## 2018-01-09 MED ORDER — PREDNISONE 20 MG PO TABS: 40.0000 mg | ORAL_TABLET | Freq: Every day | ORAL | 0 refills | Status: DC

## 2018-01-09 MED ORDER — METHYLPREDNISOLONE SODIUM SUCC 125 MG IJ SOLR
125.0000 mg | Freq: Once | INTRAMUSCULAR | Status: AC
  Administered 2018-01-09: 125 mg via INTRAVENOUS
  Filled 2018-01-09: qty 2

## 2018-01-09 NOTE — ED Provider Notes (Signed)
[facility] EM[facility]ENCY [facility] Provider Note   CSN: [id number] Arrival date & time: 01/09/18  1005     History   Chief Complaint Chief Complaint  Patient presents with  . Cough  . Back Pain  . Nasal Congestion  . Chest Pain    HPI Otho KetKhanh Gorrell is a 77 y.o. male.  77 year old male with past medical history including CAD, COPD, hypertension, hyperlipidemia who presents with cough.  Son and daughter reports that he has had 2 days of cough productive of phlegm associated with nasal congestion.  He reports some intermittent chest pain associated with the cough.  He has occasionally felt some breathing problems but mainly complains of nasal congestion.  He has had chills but no fevers.  Nausea but no vomiting or diarrhea.  No sick contacts or recent travel.  He does have an inhaler at home that he used last night with mild relief.  The history is provided by the patient and a relative.  Cough  Associated symptoms include chest pain.  Back Pain   Associated symptoms include chest pain.  Chest Pain   Associated symptoms include back pain and cough.    Past Medical History:  Diagnosis Date  . Arthritis   . CAD (coronary artery disease)    stent ? 2007  . COPD (chronic obstructive pulmonary disease) (HCC)   . GERD (gastroesophageal reflux disease)   . High cholesterol   . Hypertension     Patient Active Problem List   Diagnosis Date Noted  . CAP (community acquired pneumonia) 02/11/2015  . Acute respiratory failure with hypoxia (HCC) 02/11/2015  . COPD exacerbation (HCC) 02/11/2015  . Dehydration 02/11/2015  . Thrombocytopenia (HCC) 02/11/2015  . Hypertension   . High cholesterol   . GERD (gastroesophageal reflux disease)   . COPD (chronic obstructive pulmonary disease) (HCC)     Past Surgical History:  Procedure Laterality Date  . CORONARY ANGIOPLASTY WITH STENT PLACEMENT    . EYE SURGERY     False right eye  . HERNIA REPAIR          Home  Medications    Prior to Admission medications   Medication Sig Start Date End Date Taking? Authorizing Provider  albuterol (PROVENTIL HFA;VENTOLIN HFA) 108 (90 Base) MCG/ACT inhaler Inhale 1-2 puffs into the lungs every 6 (six) hours as needed for wheezing or shortness of breath.   Yes [provider]  albuterol (PROVENTIL) (2.5 MG/3ML) 0.083% nebulizer solution Take 3 mLs (2.5 mg total) by nebulization every 6 (six) hours as needed for wheezing or shortness of breath. 02/14/15  Yes Alison Murrayevine, Alma M, MD  alendronate (FOSAMAX) 70 MG tablet Take 70 mg by mouth every Saturday. Take with a full glass of water on an empty stomach.   Yes [provider]  amLODipine (NORVASC) 5 MG tablet Take 5 mg by mouth daily.   Yes [provider]  aspirin EC 81 MG tablet Take 81 mg by mouth daily.   Yes [provider]  benzonatate (TESSALON) 100 MG capsule Take 200 mg by mouth 3 (three) times daily as needed for cough.   Yes [provider]  cetirizine (ZYRTEC) 10 MG tablet Take 10 mg by mouth daily.   Yes [provider]  Cholecalciferol (VITAMIN D) 2000 UNITS CAPS Take 2,000 Units by mouth daily.   Yes [provider]  cycloSPORINE (RESTASIS) 0.05 % ophthalmic emulsion Place 1 drop into the left eye 2 (two) times daily.   Yes  [provider]  esomeprazole (NEXIUM) 20 MG capsule Take 20 mg by mouth daily at 12 noon.   Yes [provider]  finasteride (PROSCAR) 5 MG tablet Take 5 mg by mouth daily.   Yes [provider]  folic acid (FOLVITE) 1 MG tablet Take 1 mg by mouth daily.   Yes [provider]  isosorbide mononitrate (ISMO,MONOKET) 10 MG tablet Take 30 mg by mouth daily.   Yes [provider]  methotrexate (RHEUMATREX) 2.5 MG tablet Take 14.5 mg by mouth once a week. THURSDAY   Yes [provider]  mometasone-formoterol (DULERA) 100-5 MCG/ACT AERO Inhale 2 puffs into the lungs 2 (two) times daily.  02/14/15  Yes Alison Murray, MD  nitroGLYCERIN (NITROSTAT) 0.4 MG SL tablet Place 0.4 mg under the tongue every 5 (five) minutes as needed for chest pain.   Yes [provider]  predniSONE (DELTASONE) 2.5 MG tablet Take 2.5 mg by mouth daily with breakfast.   Yes [provider]  pregabalin (LYRICA) 200 MG capsule Take 200 mg by mouth 3 (three) times daily.   Yes [provider]  rosuvastatin (CRESTOR) 20 MG tablet Take 20 mg by mouth daily.   Yes [provider]  SINGULAIR 10 MG tablet Take 10 mg by mouth every evening.  11/17/13  Yes [provider]  tamsulosin (FLOMAX) 0.4 MG CAPS capsule Take 0.4 mg by mouth daily.  01/17/14  Yes [provider]  tiZANidine (ZANAFLEX) 4 MG tablet Take 4 mg by mouth every evening.  11/04/13  Yes [provider]  acetaminophen (TYLENOL) 325 MG tablet Take 2 tablets (650 mg total) by mouth every 6 (six) hours as needed. Patient not taking: Reported on 01/09/2018 11/30/15   Carlene Coria, PA-C  albuterol (PROVENTIL HFA;VENTOLIN HFA) 108 [phone] Base) MCG/ACT inhaler Inhale 2 puffs into the lungs every 4 (four) hours as needed for wheezing or shortness of breath. 01/09/18   Coe Angelos, Ambrose [place], MD  doxycycline (VIBRAMYCIN) 100 MG capsule Take 1 capsule (100 mg total) by mouth 2 (two) times daily for 7 days. 01/09/18 01/16/18  Emmalia Heyboer, Ambrose [place], MD  predniSONE (DELTASONE) 20 MG tablet Take 2 tablets (40 mg total) by mouth daily. Take 40 mg by mouth daily for 3 days, then 20mg  by mouth daily for 3 days, then 10mg  daily for 3 days 01/09/18   Sharla Tankard, Ambrose [place], MD    Family History Family History  Family history unknown: Yes    Social History Social History   Tobacco Use  . Smoking status: Never Smoker  . Smokeless tobacco: Never Used  Substance Use Topics  . Alcohol use: No  . Drug use: No     Allergies   Patient has no known allergies.   Review of Systems Review of Systems  Respiratory:  Positive for cough.   Cardiovascular: Positive for chest pain.  Musculoskeletal: Positive for back pain.   All other systems reviewed and are negative except that which was mentioned in HPI   Physical Exam Updated Vital Signs BP (!) 135/46   Pulse (!) 29   Temp 98.7 F (37.1 C) (Oral)   Resp 19   Ht 5\' 2"  (1.575 m)   Wt 62.6 kg (138 lb)   SpO2 93%   BMI 25.24 kg/m   Physical Exam  Constitutional: He is oriented to person, place, and time. He appears well-developed and well-nourished. No distress.  HENT:  Head: Normocephalic and atraumatic.  Moist mucous membranes  Eyes:  Conjunctivae are normal.  Neck: Neck supple.  Cardiovascular: Normal rate, regular rhythm and normal heart sounds.  No murmur heard. Pulmonary/Chest: Effort normal.  Occasional cough, expiratory wheezes b/l with mildly diminished BS LLL  Abdominal: Soft. Bowel sounds are normal. He exhibits no distension. There is no tenderness.  Musculoskeletal: He exhibits no edema.       Right lower leg: He exhibits no tenderness and no edema.       Left lower leg: He exhibits no tenderness and no edema.  Neurological: He is alert and oriented to person, place, and time.  Fluent speech  Skin: Skin is warm and dry.  Psychiatric: He has a normal mood and affect. Judgment normal.  Nursing note and vitals reviewed.    ED Treatments / Results  Labs (all labs ordered are listed, but only abnormal results are displayed) Labs Reviewed  BASIC METABOLIC PANEL - Abnormal; Notable for the following components:      Result Value   Calcium 8.7 (*)    All other components within normal limits  CBC - Abnormal; Notable for the following components:   HCT 38.9 (*)    RDW 16.6 (*)    All other components within normal limits  I-STAT TROPONIN, ED  I-STAT TROPONIN, ED    EKG EKG Interpretation  Date/Time:  Saturday January 09 2018 10:34:15 EDT Ventricular Rate:  72 PR Interval:  162 QRS Duration: 98 QT Interval:  372 QTC  Calculation: 407 R Axis:   18 Text Interpretation:  Normal sinus rhythm Left ventricular hypertrophy Possible Inferior infarct , age undetermined Abnormal ECG No significant change since last tracing Confirmed by Frederick Peers [phone]) on 01/09/2018 12:02:41 PM   Radiology Dg Chest 2 View  Result Date: 01/09/2018 CLINICAL DATA:  Productive cough for 3 days, chills EXAM: CHEST - 2 VIEW COMPARISON:  06/17/2015 FINDINGS: Borderline enlargement of cardiac silhouette. Mediastinal contours and pulmonary vascularity normal. Mild chronic bibasilar atelectasis and minimal central peribronchial thickening. Hazy opacity RIGHT upper lobe question infiltrate. Remaining lungs clear. No pleural effusion or pneumothorax. Bones demineralized. IMPRESSION: Chronic bibasilar atelectasis. Question RIGHT upper lobe pneumonia. Electronically Signed   By: Ulyses Southward M.D.   On: 01/09/2018 10:58    Procedures Procedures (including critical care time)  Medications Ordered in ED Medications  ipratropium-albuterol (DUONEB) 0.5-2.5 (3) MG/3ML nebulizer solution 3 mL (3 mLs Nebulization Given 01/09/18 1232)  methylPREDNISolone sodium succinate (SOLU-MEDROL) 125 mg/2 mL injection 125 mg (125 mg Intravenous Given 01/09/18 1231)  doxycycline (VIBRA-TABS) tablet 100 mg (100 mg Oral Given 01/09/18 1230)     Initial Impression / Assessment and Plan / ED Course  I have reviewed the triage vital signs and the nursing notes.  Pertinent labs & imaging results that were available during my care of the patient were reviewed by me and considered in my medical decision making (see chart for details).    Pt non-toxic on exam, reassuring VS. Normal WOB. He did have diffuse expiratory wheezes. Gave solumedrol and duoneb. CXR with questionable RUL pneumonia. Given productive cough and COPD, will treat w/ doxycycline and prednisone course. Labs show normal WBC count, normal trop. Considered PE, ACS however EKG reassuring, VS reassuring, and  given clear viral symptoms with wheezing on exam I feel these are unlikely to be causing his sx. Serial trop negative.  Discussed continued albuterol at home and f/u with PCP this week. Extensively reviewed return precautions.  Final Clinical Impressions(s) / ED Diagnoses   Final diagnoses:  COPD with acute  exacerbation (HCC)  Viral upper respiratory tract infection    ED Discharge Orders        Ordered    predniSONE (DELTASONE) 20 MG tablet  Daily     01/09/18 1444    albuterol (PROVENTIL HFA;VENTOLIN HFA) 108 (90 Base) MCG/ACT inhaler  Every 4 hours PRN     01/09/18 1444    doxycycline (VIBRAMYCIN) 100 MG capsule  2 times daily     01/09/18 1444       Saylor Sheckler, Ambrose [place], MD 01/09/18 1448

## 2018-01-09 NOTE — ED Notes (Signed)
Pt stable, ambulatory, states understanding of discharge instructions 

## 2018-01-09 NOTE — Discharge Instructions (Addendum)
Use albuterol (inhaler or nebulizer machine) every 4 to 6 hours as needed for wheezing. Starting tomorrow, take prednisone taper instead of your 2.5mg  prednisone. When the taper is finished, restart the 2.5mg  prednisone daily. Starting this evening, take doxycycline one twice daily until finished. See primary care provider in 2-3 days. Return to ER if any symptoms worsen.

## 2018-01-09 NOTE — ED Triage Notes (Signed)
Pt. / son Musician(translator) stated, My feet are swollen and my chest hurts worse than anything else.

## 2018-01-09 NOTE — ED Triage Notes (Signed)
Son stated, He started coughing with nasal congestion for 2 days. He has also had congestion cold and back pain.

## 2019-04-15 ENCOUNTER — Other Ambulatory Visit: Payer: Self-pay | Admitting: Critical Care Medicine

## 2019-04-19 LAB — NOVEL CORONAVIRUS, NAA: SARS-CoV-2, NAA: NOT DETECTED

## 2019-07-03 ENCOUNTER — Emergency Department (HOSPITAL_COMMUNITY): Payer: Medicare Other

## 2019-07-03 ENCOUNTER — Encounter (HOSPITAL_COMMUNITY): Payer: Self-pay

## 2019-07-03 ENCOUNTER — Emergency Department (HOSPITAL_COMMUNITY)
Admission: EM | Admit: 2019-07-03 | Discharge: 2019-07-03 | Disposition: A | Discharge status: Home / Self Care | Payer: Medicare Other | Attending: Emergency Medicine | Admitting: Emergency Medicine

## 2019-07-03 ENCOUNTER — Other Ambulatory Visit: Payer: Self-pay

## 2019-07-03 DIAGNOSIS — R55 Syncope and collapse: Secondary | ICD-10-CM | POA: Insufficient documentation

## 2019-07-03 DIAGNOSIS — Y929 Unspecified place or not applicable: Secondary | ICD-10-CM | POA: Insufficient documentation

## 2019-07-03 DIAGNOSIS — W19XXXA Unspecified fall, initial encounter: Secondary | ICD-10-CM | POA: Insufficient documentation

## 2019-07-03 DIAGNOSIS — Z7982 Long term (current) use of aspirin: Secondary | ICD-10-CM | POA: Insufficient documentation

## 2019-07-03 DIAGNOSIS — Y9301 Activity, walking, marching and hiking: Secondary | ICD-10-CM | POA: Insufficient documentation

## 2019-07-03 DIAGNOSIS — E871 Hypo-osmolality and hyponatremia: Secondary | ICD-10-CM | POA: Diagnosis not present

## 2019-07-03 DIAGNOSIS — S098XXA Other specified injuries of head, initial encounter: Secondary | ICD-10-CM | POA: Insufficient documentation

## 2019-07-03 DIAGNOSIS — I10 Essential (primary) hypertension: Secondary | ICD-10-CM | POA: Diagnosis not present

## 2019-07-03 DIAGNOSIS — Y999 Unspecified external cause status: Secondary | ICD-10-CM | POA: Diagnosis not present

## 2019-07-03 DIAGNOSIS — J449 Chronic obstructive pulmonary disease, unspecified: Secondary | ICD-10-CM | POA: Insufficient documentation

## 2019-07-03 DIAGNOSIS — Z79899 Other long term (current) drug therapy: Secondary | ICD-10-CM | POA: Diagnosis not present

## 2019-07-03 DIAGNOSIS — I259 Chronic ischemic heart disease, unspecified: Secondary | ICD-10-CM | POA: Diagnosis not present

## 2019-07-03 DIAGNOSIS — S0990XA Unspecified injury of head, initial encounter: Secondary | ICD-10-CM

## 2019-07-03 LAB — CBC
HCT: 41.8 % (ref 39.0–52.0)
Hemoglobin: 13.6 g/dL (ref 13.0–17.0)
MCH: 26.4 pg (ref 26.0–34.0)
MCHC: 32.5 g/dL (ref 30.0–36.0)
MCV: 81.2 fL (ref 80.0–100.0)
Platelets: 183 10*3/uL (ref 150–400)
RBC: 5.15 MIL/uL (ref 4.22–5.81)
RDW: 15.9 % — ABNORMAL HIGH (ref 11.5–15.5)
WBC: 7.1 10*3/uL (ref 4.0–10.5)
nRBC: 0 % (ref 0.0–0.2)

## 2019-07-03 LAB — URINALYSIS, ROUTINE W REFLEX MICROSCOPIC
Bilirubin Urine: NEGATIVE
Glucose, UA: NEGATIVE mg/dL
Hgb urine dipstick: NEGATIVE
Ketones, ur: NEGATIVE mg/dL
Leukocytes,Ua: NEGATIVE
Nitrite: NEGATIVE
Protein, ur: NEGATIVE mg/dL
Specific Gravity, Urine: 1.004 — ABNORMAL LOW (ref 1.005–1.030)
pH: 8 (ref 5.0–8.0)

## 2019-07-03 LAB — BASIC METABOLIC PANEL
Anion gap: 7 (ref 5–15)
BUN: 10 mg/dL (ref 8–23)
CO2: 28 mmol/L (ref 22–32)
Calcium: 8.3 mg/dL — ABNORMAL LOW (ref 8.9–10.3)
Chloride: 95 mmol/L — ABNORMAL LOW (ref 98–111)
Creatinine, Ser: 1.07 mg/dL (ref 0.61–1.24)
GFR calc Af Amer: >60 mL/min (ref >60)
GFR calc non Af Amer: >60 mL/min (ref >60)
Glucose, Bld: 109 mg/dL — ABNORMAL HIGH (ref 70–99)
Potassium: 4 mmol/L (ref 3.5–5.1)
Sodium: 130 mmol/L — ABNORMAL LOW (ref 135–145)

## 2019-07-03 LAB — CBG MONITORING, ED: Glucose-Capillary: 101 mg/dL — ABNORMAL HIGH (ref 70–99)

## 2019-07-03 MED ORDER — SODIUM CHLORIDE 0.9% FLUSH: 3.0000 mL | Freq: Once | INTRAVENOUS | Status: DC

## 2019-07-03 MED ORDER — SODIUM CHLORIDE 0.9 % IV BOLUS
1000.0000 mL | Freq: Once | INTRAVENOUS | Status: AC
  Administered 2019-07-03: 1000 mL via INTRAVENOUS

## 2019-07-03 NOTE — ED Triage Notes (Signed)
Patient's son reports that the patient had a syncopal episode and fell backwards approx at 0930 and hit his head. Patient also c/o right hip pain.

## 2019-07-03 NOTE — ED Notes (Signed)
An After Visit Summary was printed and given to the patient. Discharge instructions given and no further questions at this time.  Pt leaving with daughter.  

## 2019-07-03 NOTE — Discharge Instructions (Addendum)
Follow up with primary care provider to have sodium level rechecked.

## 2019-07-03 NOTE — ED Provider Notes (Signed)
[facility]-EMERGENCY DEPT Provider Note   CSN: [id number] Arrival date & time: 07/03/19  1037     History   Chief Complaint Chief Complaint  Patient presents with  . Loss of Consciousness  . Head Injury  . Hip Pain    HPI Jesse Scott is a 78 y.o. male.     78yo M w/ PMH including CAD, COPD, HTN, HLD, GERD who p/w syncope. This morning around 9:30am, he was walking and got dizzy and almost passed out. He tried to grab something but fell and struck the back of his head. He reports R hip pain.  He denies preceding chest pain or shortness of breath. No recent illness including no fever, cough, vomiting, or urinary problems. He had diarrhea a few days ago that resolved. No recent changes to medications.  Daughter notes that patient often has a dry mouth and drinks water frequently.  The history is provided by the patient. The history is limited by a language barrier. A language interpreter was used.  Loss of Consciousness Head Injury Hip Pain    Past Medical History:  Diagnosis Date  . Arthritis   . CAD (coronary artery disease)    stent ? 2007  . COPD (chronic obstructive pulmonary disease) (HCC)   . GERD (gastroesophageal reflux disease)   . High cholesterol   . Hypertension     Patient Active Problem List   Diagnosis Date Noted  . CAP (community acquired pneumonia) 02/11/2015  . Acute respiratory failure with hypoxia (HCC) 02/11/2015  . COPD exacerbation (HCC) 02/11/2015  . Dehydration 02/11/2015  . Thrombocytopenia (HCC) 02/11/2015  . Hypertension   . High cholesterol   . GERD (gastroesophageal reflux disease)   . COPD (chronic obstructive pulmonary disease) (HCC)     Past Surgical History:  Procedure Laterality Date  . CORONARY ANGIOPLASTY WITH STENT PLACEMENT    . EYE SURGERY     False right eye  . HERNIA REPAIR          Home Medications    Prior to Admission medications   Medication Sig Start Date End Date Taking?  Authorizing Provider  albuterol (PROVENTIL HFA;VENTOLIN HFA) 108 (90 Base) MCG/ACT inhaler Inhale 1-2 puffs into the lungs every 6 (six) hours as needed for wheezing or shortness of breath.   Yes [provider]  amLODipine (NORVASC) 5 MG tablet Take 5 mg by mouth daily.   Yes [provider]  aspirin EC 81 MG tablet Take 81 mg by mouth daily.   Yes [provider]  Cholecalciferol (VITAMIN D) 2000 UNITS CAPS Take 2,000 Units by mouth daily.   Yes [provider]  DULoxetine (CYMBALTA) 30 MG capsule Take 30 mg by mouth daily.   Yes [provider]  esomeprazole (NEXIUM) 20 MG capsule Take 20 mg by mouth daily at 12 noon.   Yes [provider]  methotrexate (RHEUMATREX) 2.5 MG tablet Take 14.5 mg by mouth every Thursday.    Yes [provider]  neomycin-polymyxin-hydrocortisone (CORTISPORIN) OTIC solution Place 5 drops into the left ear 2 (two) times daily.   Yes [provider]  nitroGLYCERIN (NITROSTAT) 0.4 MG SL tablet Place 0.4 mg under the tongue every 5 (five) minutes as needed for chest pain.   Yes [provider]  predniSONE (DELTASONE) 2.5 MG tablet Take 2.5 mg by mouth daily with breakfast.   Yes [provider]  pregabalin (LYRICA) 200 MG capsule Take 200 mg by mouth 3 (three) times daily.  Yes [provider]  rosuvastatin (CRESTOR) 20 MG tablet Take 20 mg by mouth daily.   Yes [provider]  SINGULAIR 10 MG tablet Take 10 mg by mouth every evening.  11/17/13  Yes [provider]  tamsulosin (FLOMAX) 0.4 MG CAPS capsule Take 0.4 mg by mouth daily.  01/17/14  Yes [provider]  tiZANidine (ZANAFLEX) 4 MG tablet Take 4 mg by mouth every evening.  11/04/13  Yes [provider]  acetaminophen (TYLENOL) 325 MG tablet Take 2 tablets (650 mg total) by mouth every 6 (six) hours as needed. Patient not taking: Reported on 01/09/2018 11/30/15   Anne Ng, PA-C   albuterol (PROVENTIL HFA;VENTOLIN HFA) 108 [phone] Base) MCG/ACT inhaler Inhale 2 puffs into the lungs every 4 (four) hours as needed for wheezing or shortness of breath. Patient not taking: Reported on 07/03/2019 01/09/18   Tamico Mundo, Wenda Overland, MD  albuterol (PROVENTIL) (2.5 MG/3ML) 0.083% nebulizer solution Take 3 mLs (2.5 mg total) by nebulization every 6 (six) hours as needed for wheezing or shortness of breath. Patient not taking: Reported on 07/03/2019 02/14/15   Robbie Lis, MD  mometasone-formoterol [facility]) 100-5 MCG/ACT AERO Inhale 2 puffs into the lungs 2 (two) times daily. Patient not taking: Reported on 07/03/2019 02/14/15   Robbie Lis, MD    Family History Family History  Family history unknown: Yes    Social History Social History   Tobacco Use  . Smoking status: Never Smoker  . Smokeless tobacco: Never Used  Substance Use Topics  . Alcohol use: No  . Drug use: No     Allergies   Zolpidem tartrate   Review of Systems Review of Systems  Cardiovascular: Positive for syncope.   All other systems reviewed and are negative except that which was mentioned in HPI   Physical Exam Updated Vital Signs BP 129/79   Pulse 72   Temp 97.7 F (36.5 C) (Oral)   Resp 16   Ht 5\' 2"  (1.575 m)   Wt 63.5 kg   SpO2 99%   BMI 25.61 kg/m   Physical Exam Vitals signs and nursing note reviewed.  Constitutional:      General: He is not in acute distress.    Appearance: He is well-developed.  HENT:     Head: Normocephalic.     Comments: Very small amount of swelling R parietal scalp Eyes:     Extraocular Movements: Extraocular movements intact.     Conjunctiva/sclera: Conjunctivae normal.     Pupils: Pupils are equal, round, and reactive to light.  Neck:     Musculoskeletal: Normal range of motion and neck supple.  Cardiovascular:     Rate and Rhythm: Normal rate and regular rhythm.     Heart sounds: Normal heart sounds. No murmur.  Pulmonary:     Effort:  Pulmonary effort is normal.     Breath sounds: Normal breath sounds.  Abdominal:     General: Bowel sounds are normal. There is no distension.     Palpations: Abdomen is soft.     Tenderness: There is no abdominal tenderness.  Skin:    General: Skin is warm and dry.  Neurological:     Mental Status: He is alert and oriented to person, place, and time.     Comments: Fluent speech  Psychiatric:        Judgment: Judgment normal.      ED Treatments / Results  Labs (all labs ordered are listed, but only  abnormal results are displayed) Labs Reviewed  BASIC METABOLIC PANEL - Abnormal; Notable for the following components:      Result Value   Sodium 130 (*)    Chloride 95 (*)    Glucose, Bld 109 (*)    Calcium 8.3 (*)    All other components within normal limits  CBC - Abnormal; Notable for the following components:   RDW 15.9 (*)    All other components within normal limits  URINALYSIS, ROUTINE W REFLEX MICROSCOPIC - Abnormal; Notable for the following components:   Color, Urine STRAW (*)    Specific Gravity, Urine 1.004 (*)    All other components within normal limits  CBG MONITORING, ED - Abnormal; Notable for the following components:   Glucose-Capillary 101 (*)    All other components within normal limits    EKG EKG Interpretation  Date/Time:  Sunday July 03 2019 11:15:37 EDT Ventricular Rate:  60 PR Interval:    QRS Duration: 109 QT Interval:  414 QTC Calculation: 414 R Axis:   50 Text Interpretation:  Sinus rhythm Abnormal R-wave progression, early transition Probable left ventricular hypertrophy Abnormal inferior Q waves ST elevation, consider anterior injury previous inferior Q waves and T wave inversions have improved Confirmed by Frederick PeersLittle, Louis Ivery [phone]([id number]) on 07/03/2019 12:12:50 PM   Radiology Ct Head Wo Contrast  Result Date: 07/03/2019 CLINICAL DATA:  Syncopal episode and fell backwards hitting head. EXAM: CT HEAD WITHOUT CONTRAST TECHNIQUE: Contiguous  axial images were obtained from the base of the skull through the vertex without intravenous contrast. COMPARISON:  None. FINDINGS: Brain: Ventricles, cisterns and other CSF spaces are normal. There is mild chronic ischemic microvascular disease. There is no mass, mass effect, shift of midline structures or acute hemorrhage. No evidence of acute infarction. Vascular: No hyperdense vessel or unexpected calcification. Skull: Normal. Negative for fracture or focal lesion. Sinuses/Orbits: Prosthetic right globe. Left orbit is normal. Opacification over the right maxillary sinus compatible with chronic inflammatory change. Tiny air-fluid level over the left sphenoid sinus. Opacification over the left mastoid air cells. Other: None. IMPRESSION: No acute findings. Mild chronic ischemic microvascular disease. Chronic sinus inflammatory change. Opacification over the left mastoid air cells. Electronically Signed   By: Elberta Fortisaniel  Boyle M.D.   On: 07/03/2019 14:41   Dg Hip Unilat  With Pelvis 2-3 Views Right  Result Date: 07/03/2019 CLINICAL DATA:  Syncope and fell backwards. EXAM: DG HIP (WITH OR WITHOUT PELVIS) 2-3V RIGHT COMPARISON:  None. FINDINGS: There is no evidence of hip fracture or dislocation. There is no evidence of arthropathy or other focal bone abnormality. IMPRESSION: Negative. Electronically Signed   By: Kennith CenterEric  Mansell M.D.   On: 07/03/2019 12:41   Dg Femur Min 2 Views Right  Result Date: 07/03/2019 CLINICAL DATA:  Syncopal episode with fall and right leg pain. EXAM: RIGHT FEMUR 2 VIEWS COMPARISON:  None. FINDINGS: There is no evidence of fracture or other focal bone lesions. Soft tissues are unremarkable. IMPRESSION: No acute findings. Electronically Signed   By: Elberta Fortisaniel  Boyle M.D.   On: 07/03/2019 14:38    Procedures Procedures (including critical care time)  Medications Ordered in ED Medications  sodium chloride flush (NS) 0.9 % injection 3 mL (has no administration in time range)  sodium  chloride 0.9 % bolus 1,000 mL (1,000 mLs Intravenous Bolus from Bag 07/03/19 1302)     Initial Impression / Assessment and Plan / ED Course  I have reviewed the triage vital signs and the nursing notes.  Pertinent labs & imaging results that were available during my care of the patient were reviewed by me and considered in my medical decision making (see chart for details).       Comfortable on exam, hypertensive. EKG without ischemic changes or arrhythmia. Labs show Na 130, Cl 95, normal Cr, normal CBC.  Gave small fluid bolus given dizziness and positive orthostatic vital signs.    On further discussion with daughter, suspect that his low sodium may be related to free water intake.  I have discussed modest fluid restriction.  CT of head and plain films of extremity negative for acute injury.  Patient remains well-appearing on reassessment.  I discussed supportive measures with daughter and extensively reviewed return precautions.  Recommended PCP follow-up this week for recheck of sodium.  Final Clinical Impressions(s) / ED Diagnoses   Final diagnoses:  Near syncope  Hyponatremia  Injury of head, initial encounter    ED Discharge Orders    None       Kimberlin Scheel, Ambrose Finlandachel Morgan, MD 07/03/19 [phone]

## 2019-12-08 IMAGING — CR DG FEMUR 2+V*R*
4 series · 4 of 4 positions shown · non-contrast
Comparison: None.

CLINICAL DATA: Syncopal episode with fall and right leg pain.

EXAM:
RIGHT FEMUR 2 VIEWS

[t femur proximal ap right]
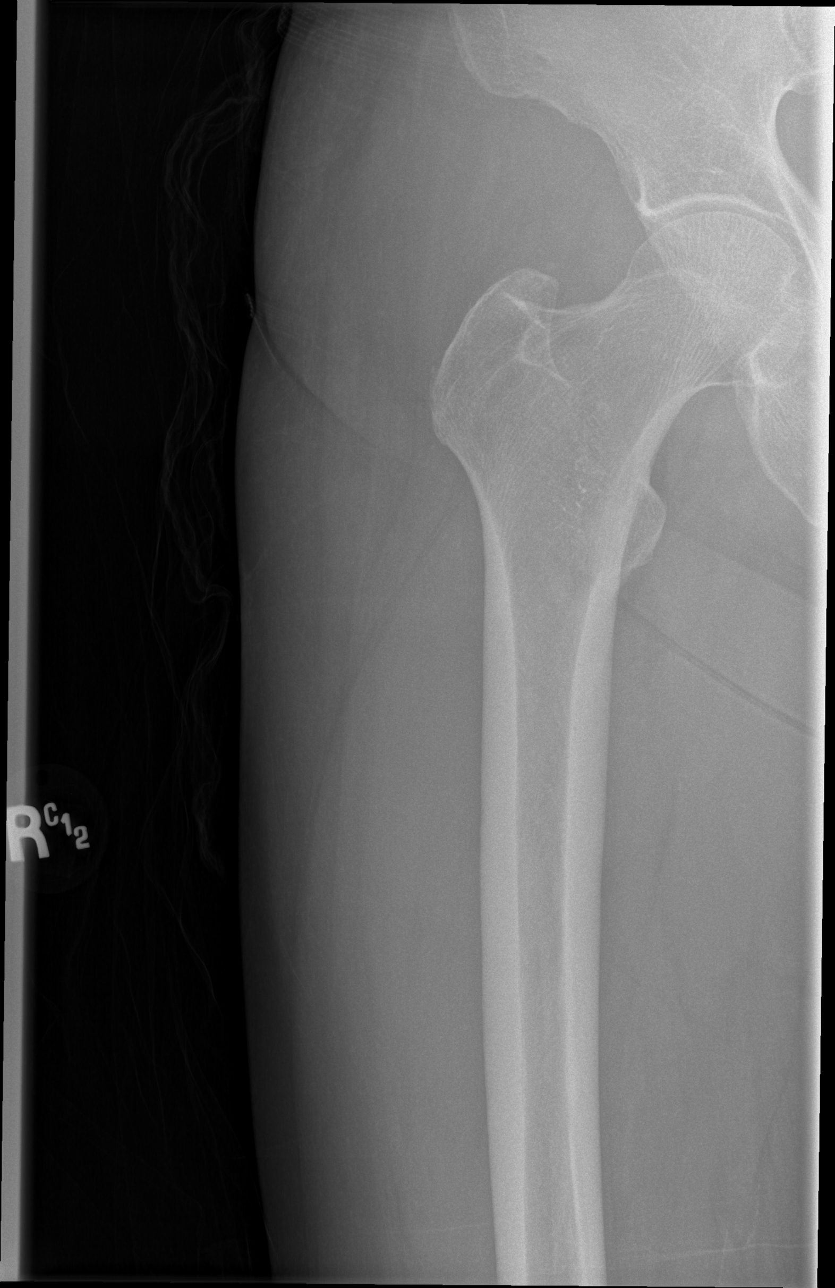

[t femur distal ap right]
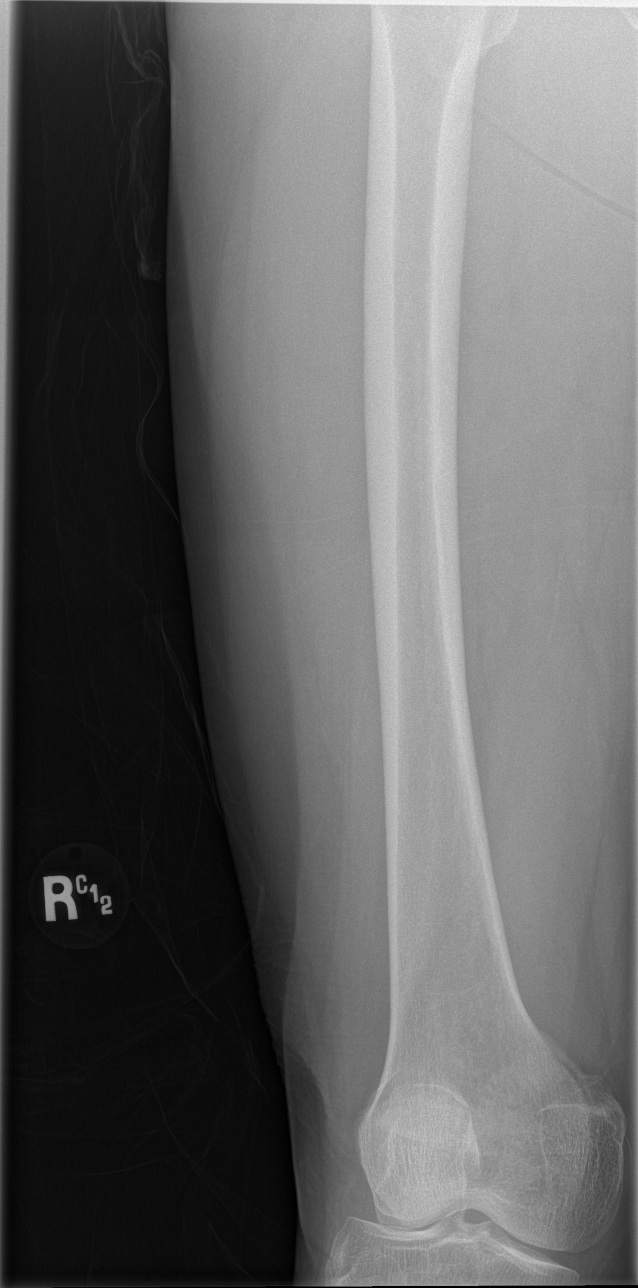

[t femur distal lat right]
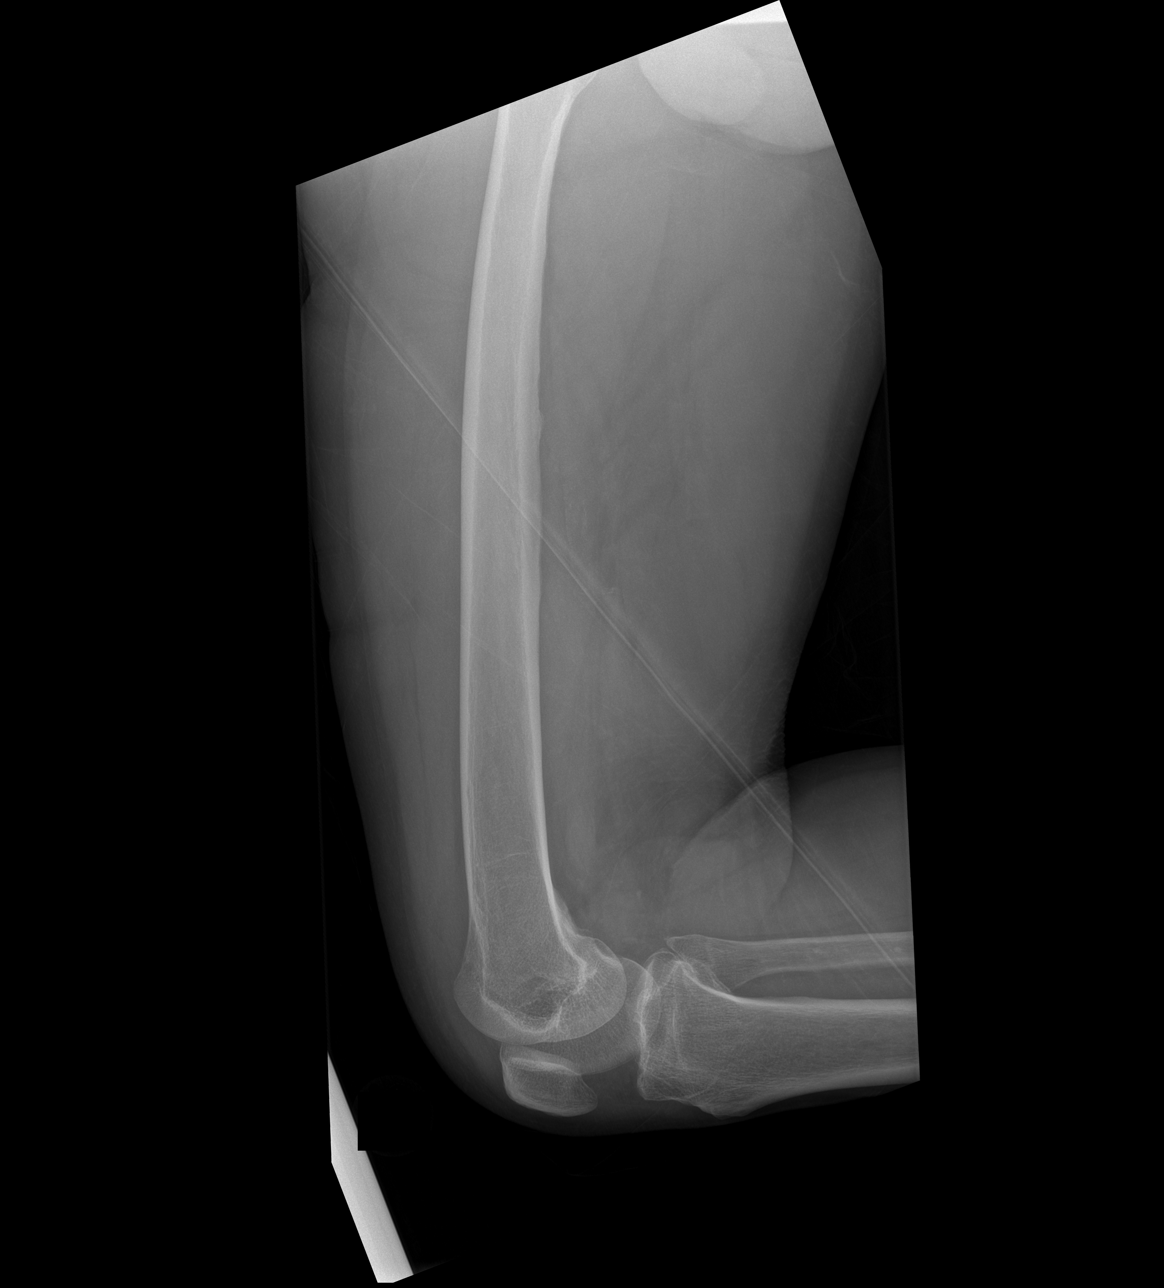

[t femur proximal lat right]
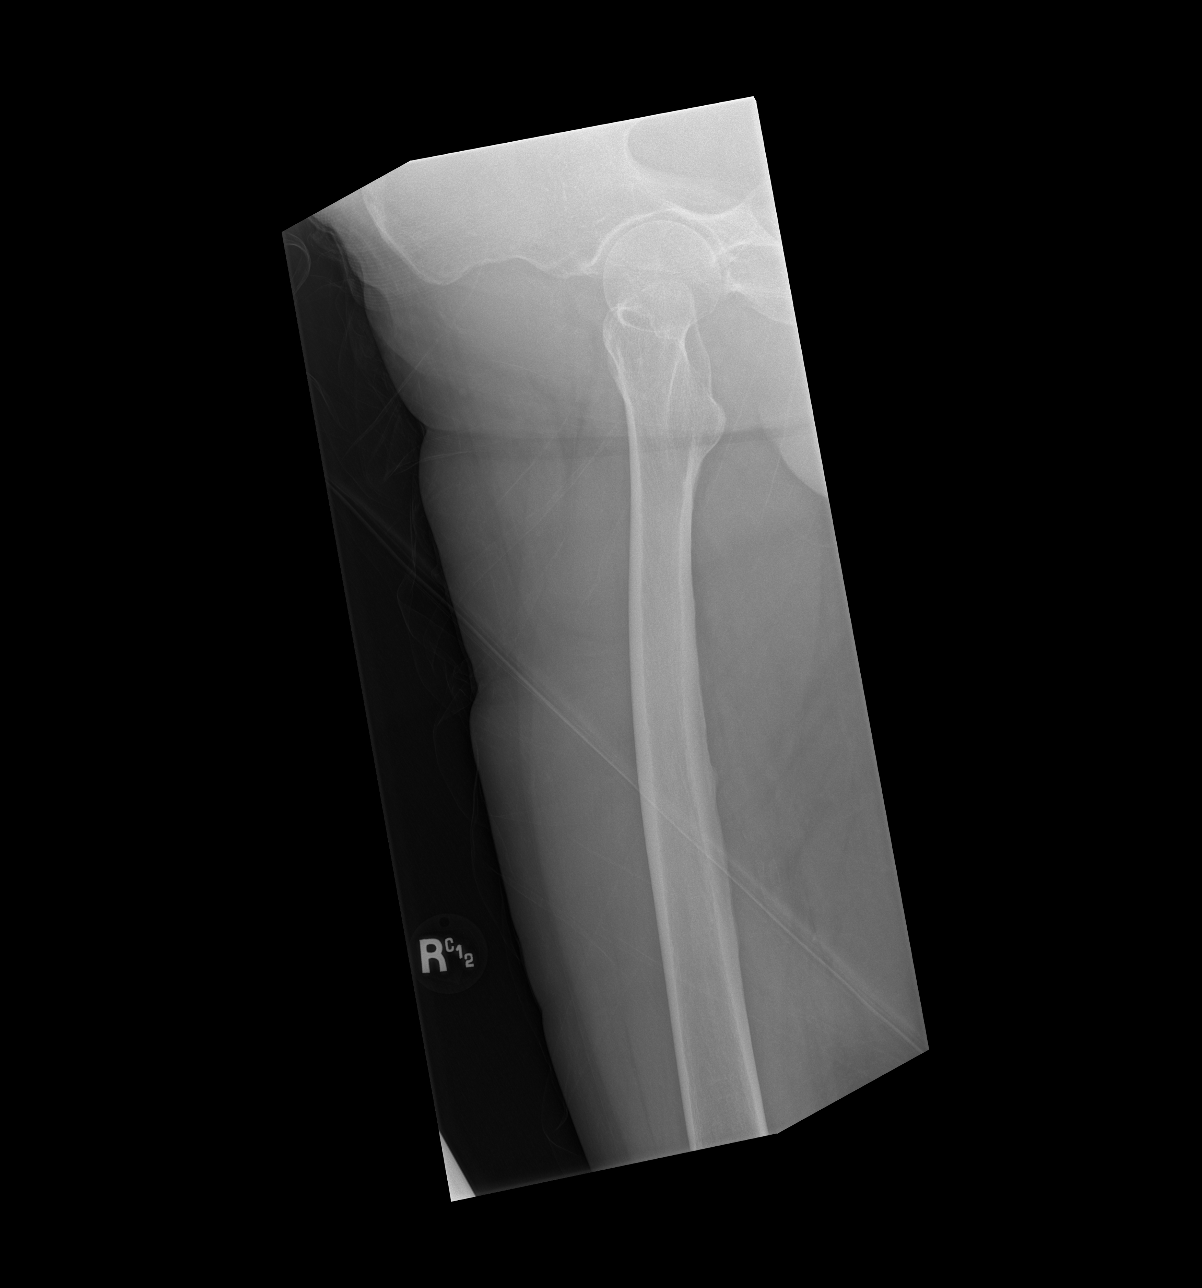

[4 of 4 positions shown; findings below may reference images not displayed]

FINDINGS: There is no evidence of fracture or other focal bone lesions. Soft
tissues are unremarkable.
IMPRESSION: No acute findings.
